# Patient Record
Sex: Female | Born: 1980 | Hispanic: No | Marital: Married | Smoking: Never smoker
Health system: Southern US, Community
[De-identification: ages and names within clinical notes are randomized; demographics above are authoritative.]

## PROBLEM LIST (undated history)

## (undated) DIAGNOSIS — F329 Major depressive disorder, single episode, unspecified: Secondary | ICD-10-CM

## (undated) DIAGNOSIS — F32A Depression, unspecified: Secondary | ICD-10-CM

## (undated) DIAGNOSIS — T7840XA Allergy, unspecified, initial encounter: Secondary | ICD-10-CM

## (undated) DIAGNOSIS — L309 Dermatitis, unspecified: Secondary | ICD-10-CM

## (undated) DIAGNOSIS — F419 Anxiety disorder, unspecified: Secondary | ICD-10-CM

## (undated) DIAGNOSIS — E079 Disorder of thyroid, unspecified: Secondary | ICD-10-CM

## (undated) DIAGNOSIS — J45909 Unspecified asthma, uncomplicated: Secondary | ICD-10-CM

## (undated) HISTORY — DX: Disorder of thyroid, unspecified: E07.9

## (undated) HISTORY — DX: Major depressive disorder, single episode, unspecified: F32.9

## (undated) HISTORY — DX: Anxiety disorder, unspecified: F41.9

## (undated) HISTORY — DX: Depression, unspecified: F32.A

## (undated) HISTORY — DX: Allergy, unspecified, initial encounter: T78.40XA

## (undated) HISTORY — DX: Unspecified asthma, uncomplicated: J45.909

## (undated) HISTORY — DX: Dermatitis, unspecified: L30.9

---

## 2010-06-24 DIAGNOSIS — J309 Allergic rhinitis, unspecified: Secondary | ICD-10-CM | POA: Insufficient documentation

## 2010-06-24 DIAGNOSIS — J329 Chronic sinusitis, unspecified: Secondary | ICD-10-CM | POA: Insufficient documentation

## 2010-06-24 DIAGNOSIS — J45909 Unspecified asthma, uncomplicated: Secondary | ICD-10-CM | POA: Insufficient documentation

## 2010-06-24 DIAGNOSIS — E669 Obesity, unspecified: Secondary | ICD-10-CM | POA: Insufficient documentation

## 2010-06-24 DIAGNOSIS — K219 Gastro-esophageal reflux disease without esophagitis: Secondary | ICD-10-CM | POA: Insufficient documentation

## 2010-09-10 DIAGNOSIS — K589 Irritable bowel syndrome without diarrhea: Secondary | ICD-10-CM | POA: Insufficient documentation

## 2010-09-10 DIAGNOSIS — G4733 Obstructive sleep apnea (adult) (pediatric): Secondary | ICD-10-CM | POA: Insufficient documentation

## 2010-09-10 DIAGNOSIS — E8881 Metabolic syndrome: Secondary | ICD-10-CM | POA: Insufficient documentation

## 2010-10-02 DIAGNOSIS — R5383 Other fatigue: Secondary | ICD-10-CM | POA: Insufficient documentation

## 2011-05-14 DIAGNOSIS — G4489 Other headache syndrome: Secondary | ICD-10-CM | POA: Insufficient documentation

## 2012-12-14 DIAGNOSIS — E559 Vitamin D deficiency, unspecified: Secondary | ICD-10-CM | POA: Insufficient documentation

## 2012-12-29 DIAGNOSIS — E042 Nontoxic multinodular goiter: Secondary | ICD-10-CM | POA: Insufficient documentation

## 2014-09-04 ENCOUNTER — Ambulatory Visit (INDEPENDENT_AMBULATORY_CARE_PROVIDER_SITE_OTHER): Payer: 59 | Admitting: Physician Assistant

## 2014-09-04 VITALS — BP 120/80 | HR 74 | Temp 98.2°F | Resp 16 | Ht 64.0 in | Wt 218.0 lb

## 2014-09-04 DIAGNOSIS — N943 Premenstrual tension syndrome: Secondary | ICD-10-CM

## 2014-09-04 DIAGNOSIS — F3281 Premenstrual dysphoric disorder: Secondary | ICD-10-CM | POA: Insufficient documentation

## 2014-09-04 MED ORDER — CITALOPRAM HYDROBROMIDE 10 MG PO TABS: 10.0000 mg | ORAL_TABLET | Freq: Every day | ORAL | Status: DC

## 2014-09-04 MED ORDER — IBUPROFEN 800 MG PO TABS: ORAL_TABLET | ORAL | Status: DC

## 2014-09-04 MED ORDER — CLONAZEPAM 0.5 MG PO TABS: 0.2500 mg | ORAL_TABLET | Freq: Two times a day (BID) | ORAL | Status: DC | PRN

## 2014-09-04 MED ORDER — CLONAZEPAM 0.5 MG PO TABS: 0.2500 mg | ORAL_TABLET | Freq: Two times a day (BID) | ORAL | Status: AC | PRN

## 2014-09-04 NOTE — Progress Notes (Signed)
09/04/2014 at 8:51 PM  [name] / DOB: 1980-06-28 / MRN: [id number]  The patient  does not have a problem list on file.  SUBJECTIVE  Chief complaint: Other   Ms. Gassen is a well appearing 34 y.o. female complaining of mood swings and agitation that starts roughly one week before her period. She notably "snap and screams" at her husband during these periods and would like to consider medicine to control these symptoms. She has tried ibuprofen 400 qd for her symptoms which did not help.  She has also taken OCPs in the past and reports that she bleed constantly while taking them.  She has a history of anxiety and depression and is not taking an SSRI currently. She takes 40 mg of omeprazole daily for GERD.  She denies depression, anhedonia, poor sleep, changes in concentration and appetite, psychomotor slowing, guilt and hopelessness, and SI/HI. She does report some marital stress at this time, but does not want to go into details.  She and her husband are seeing a marriage Veterinary surgeon.     She  has a past medical history of Allergy; Anxiety; Asthma; Depression; and Thyroid disease.    Medications reviewed and updated by myself where necessary, and exist elsewhere in the encounter.   Ms. Remmert is allergic to fish allergy; peanuts; amoxicillin; molds & smuts; percocet; and prednisone. She  reports that she has never smoked. She does not have any smokeless tobacco history on file. She  has no sexual activity history on file. The patient  has no past surgical history on file.  Her family history includes Cancer in her maternal grandfather, maternal grandmother, paternal grandfather, and paternal grandmother; Hypertension in her maternal grandfather, maternal grandmother, paternal grandfather, and paternal grandmother; Thyroid disease in her mother.  Review of Systems  Constitutional: Negative for fever.  Eyes: Negative.   Respiratory: Negative for cough.   Cardiovascular: Negative  for chest pain.  Gastrointestinal: Negative.   Genitourinary: Negative.   Musculoskeletal: Negative.   Skin: Negative for itching and rash.  Neurological: Negative.  Negative for headaches.  Endo/Heme/Allergies: Negative.   Psychiatric/Behavioral: Negative.     OBJECTIVE  Her  height is  (1.626 m) and weight is 218 lb (98.884 kg). Her oral temperature is 98.2 F (36.8 C). Her blood pressure is 120/80 and her pulse is 74. Her respiration is 16 and oxygen saturation is 100%.  The patient's body mass index is 37.4 kg/(m^2).  Physical Exam  Constitutional: She is oriented to person, place, and time. She appears well-developed and well-nourished. No distress.  Neck: Normal range of motion.  Cardiovascular: Normal rate, regular rhythm, normal heart sounds and intact distal pulses.  Exam reveals no gallop and no friction rub.   No murmur heard. Respiratory: Effort normal and breath sounds normal.  GI: Soft. Bowel sounds are normal.  Musculoskeletal: Normal range of motion. She exhibits no edema or tenderness.  Neurological: She is alert and oriented to person, place, and time. She has normal reflexes. No cranial nerve deficit. Coordination normal.  Skin: Skin is warm and dry. No rash noted. She is not diaphoretic. No erythema. No pallor.   Mental Status Exam:  Appearance: Approprate to the situation. Seen crying at times. Eye Contact::  Good Psychomotor:  Normal Speech:  Clear and Coherent and Normal Rate Volume:  Normal Mood:  "Even and happy" Affect:  Non-Congruent and anxious and sad Thought Process:  Intact Orientation:  Full (Time, Place, and Person) Thought Content:  Negative Suicidal  Thoughts:  No Homicidal Thoughts:  No Judgement:  Fair Insight:  Lacking   No results found for this or any previous visit (from the past 24 hour(s)).  ASSESSMENT & PLAN  Britta MccreedyBarbara was seen today for other.  Diagnoses and all orders for this visit:  Premenstrual dysphoric disorder:  Patient with several hall marks of this condition.  Will treat with SSRI therapy for mood.  Decreased dose of Celexa chosen given that patient is taking 40 mg of omeprazole daily, which will likely yield a dose of citalopram equivalent to 20 mg daily.  Other options discussed regarding birth control or implantable device that would cause her to no longer have periods, but patient had a poor response to this in the past. Will provide a short, one time course of Klonopin prn for bridge therapy.  She is to take 800 mg ibuprofen prn for somatic symptoms.  Orders: -     citalopram (CELEXA) 10 MG tablet; Take 1 tablet (10 mg total) by mouth daily. Take 1/2 tab for the first week only. -     clonazePAM (KLONOPIN) 0.5 MG tablet; Take 0.5 tablets (0.25 mg total) by mouth 2 (two) times daily as needed for anxiety. -     ibuprofen (ADVIL,MOTRIN) 800 MG tablet; Take 1 tab every eight hours for pain   The patient was advised to call or come back to clinic if she does not see an improvement in symptoms, or worsens with the above plan.   Deliah BostonMichael Buren Havey, MHS, PA-C [facility] 09/04/2014 8:51 PM

## 2014-12-05 ENCOUNTER — Other Ambulatory Visit: Payer: Self-pay | Admitting: Family Medicine

## 2016-02-05 ENCOUNTER — Other Ambulatory Visit (HOSPITAL_COMMUNITY): Payer: Self-pay | Admitting: Obstetrics

## 2016-02-05 DIAGNOSIS — Z3149 Encounter for other procreative investigation and testing: Secondary | ICD-10-CM

## 2016-02-09 ENCOUNTER — Ambulatory Visit (HOSPITAL_COMMUNITY)
Admission: RE | Admit: 2016-02-09 | Discharge: 2016-02-09 | Disposition: A | Discharge status: Home / Self Care | Payer: 59 | Source: Ambulatory Visit | Attending: Obstetrics | Admitting: Obstetrics

## 2016-02-09 ENCOUNTER — Encounter (INDEPENDENT_AMBULATORY_CARE_PROVIDER_SITE_OTHER): Payer: Self-pay

## 2016-02-09 DIAGNOSIS — Z3149 Encounter for other procreative investigation and testing: Secondary | ICD-10-CM

## 2016-02-09 DIAGNOSIS — N979 Female infertility, unspecified: Secondary | ICD-10-CM | POA: Diagnosis present

## 2016-02-09 MED ORDER — IOPAMIDOL (ISOVUE-300) INJECTION 61%
30.0000 mL | Freq: Once | INTRAVENOUS | Status: AC | PRN
  Administered 2016-02-09: 5 mL

## 2016-07-27 ENCOUNTER — Encounter: Payer: Self-pay | Admitting: Dietician

## 2016-07-27 ENCOUNTER — Encounter: Payer: 59 | Attending: Internal Medicine | Admitting: Dietician

## 2016-07-27 DIAGNOSIS — Z713 Dietary counseling and surveillance: Secondary | ICD-10-CM | POA: Diagnosis present

## 2016-07-27 DIAGNOSIS — E669 Obesity, unspecified: Secondary | ICD-10-CM | POA: Diagnosis not present

## 2016-07-27 DIAGNOSIS — IMO0001 Reserved for inherently not codable concepts without codable children: Secondary | ICD-10-CM

## 2016-07-27 NOTE — Patient Instructions (Addendum)
Continue to be active most days of the week. Resume your vitamin D  Consider meal planning. Avoid skipping meals.  A small meal is fine. A small snack is fine if you are hungry. Increase your vegetable intake particularly the non starchy vegetables. (VARIETY)  Choose whole grains.   2 or less servings of dairy per day. Aerobic and resistance training Consider Omega 3 (1500mg -4 grams daily) Consider Inositol (1.2-1.4 g/day)

## 2016-07-27 NOTE — Progress Notes (Signed)
  Medical Nutrition Therapy:  Appt start time: 1410 end time:  1510.   Assessment:  Primary concerns today: Patient is here today alone.  She would like to lose weight.  Hx includes:  IBS, many allergies, infertility, GERD, PCOS, hypothyroidism, plantar fascitis, and vitamin D deficiency.  Weight hx: 242 lbs today= highest weight 218 lbs 14 months ago prior to starting medications for infertility. 149 lbs 6 years ago= lowest weight (working out a lot)   Patient lives with her husband and mother.  Patient does the shopping and cooking.  Patient works for [employer] from home.  She went to school for nutrition and fitness and is currently going back to culinary school.  Preferred Learning Style:   No preference indicated   Learning Readiness:   Ready  MEDICATIONS: see list   DIETARY INTAKE: 1-2 meals 2-3 snacks "grazes" throughout morning and afternoon while she is working [employer]ed foods include pork, dark soda (makes IBS worse), apples, almonds, and shellfish (anyfalaxis),  "I do not like to eat if I am not hungry.", cooks without oil  24-hr recall:  Starts work at 6 am B ( AM): skips "never a breakfast person" Snk (9:30 AM): instant oatmeal (1 pack) L (1 PM): skips more than half the time - Malaw[place]urkey and provolone sandwich on [employer] with ranch, OJ Snk ( PM): homemade oatmeal cookies, ritz crackers, grapes, or chocolate D (6:30 PM): baked fish or chicken, rice, broccoli OR hamburger and fries Snk ( PM): none Beverages: low acid OJ, occasional sprite, water  Usual physical activity: walk daily about 2 miles with dogs, squats and weight lifting 2 times per week.  Estimated energy needs: 1400 calories 158 g carbohydrates 105 g protein 39 g fat  Progress Towards Goal(s):  In progress.   Nutritional Diagnosis:  NB-1.1 Food and nutrition-related knowledge deficit As related to weight loss.  As evidenced by patient report.    Intervention:  Nutrition counseling/education  related to weight loss.  Discussed importance of a regular meal schedule, avoid skipping meals, taking the Vitamin D, and continuing to stay active.  Patient stated that she felt that she needed to meal plan again.  Also discussed guidelines for PCOS.  Continue to be active most days of the week. Resume your vitamin D  Consider meal planning. Avoid skipping meals.  A small meal is fine. A small snack is fine if you are hungry. Increase your vegetable intake particularly the non starchy vegetables. (VARIETY)  Choose whole grains.   2 or less servings of dairy per day. Aerobic and resistance training Consider Omega 3 (1500mg -4 grams daily) Consider Inositol (1.2-1.4 g/day)  Teaching Method Utilized:  Visual Auditory Hands on  Handouts given during visit include:  Meal plan card  Snack list  Probiotic list  Barriers to learning/adherence to lifestyle change: none  Demonstrated degree of understanding via:  Teach Back   Monitoring/Evaluation:  Dietary intake, exercise, and body weight prn.

## 2016-08-22 ENCOUNTER — Emergency Department (HOSPITAL_COMMUNITY): Payer: 59

## 2016-08-22 ENCOUNTER — Emergency Department (HOSPITAL_COMMUNITY)
Admission: EM | Admit: 2016-08-22 | Discharge: 2016-08-22 | Disposition: A | Discharge status: Home / Self Care | Payer: 59 | Attending: Emergency Medicine | Admitting: Emergency Medicine

## 2016-08-22 ENCOUNTER — Encounter (HOSPITAL_COMMUNITY): Payer: Self-pay

## 2016-08-22 DIAGNOSIS — Z91010 Allergy to peanuts: Secondary | ICD-10-CM | POA: Insufficient documentation

## 2016-08-22 DIAGNOSIS — J45909 Unspecified asthma, uncomplicated: Secondary | ICD-10-CM | POA: Diagnosis not present

## 2016-08-22 DIAGNOSIS — J111 Influenza due to unidentified influenza virus with other respiratory manifestations: Secondary | ICD-10-CM | POA: Diagnosis not present

## 2016-08-22 DIAGNOSIS — Z79899 Other long term (current) drug therapy: Secondary | ICD-10-CM | POA: Diagnosis not present

## 2016-08-22 DIAGNOSIS — R69 Illness, unspecified: Secondary | ICD-10-CM

## 2016-08-22 DIAGNOSIS — R05 Cough: Secondary | ICD-10-CM | POA: Diagnosis present

## 2016-08-22 MED ORDER — ACETAMINOPHEN 325 MG PO TABS
ORAL_TABLET | ORAL | Status: AC
  Filled 2016-08-22: qty 2

## 2016-08-22 MED ORDER — ACETAMINOPHEN 325 MG PO TABS
650.0000 mg | ORAL_TABLET | Freq: Once | ORAL | Status: AC
  Administered 2016-08-22: 650 mg via ORAL

## 2016-08-22 NOTE — ED Provider Notes (Signed)
[facility]-EMERGENCY DEPT Provider Note   CSN: [id number] Arrival date & time: 08/22/16  1228   By signing my name below, I, [name], attest that this documentation has been prepared under the direction and in the presence of Jerelyn Scott, MD. Electronically Signed: [name], ED Scribe. 08/22/16. 2:45 PM.  History   Chief Complaint Chief Complaint  Patient presents with  . Cough    HPI Faith Moody is a 36 y.o. female with a PMHx of asthma, who presents to the Emergency Department complaining of productive cough x blood-tinged sputum onset 1 week worsening today. Pt reports associated CP x 4 days, generalized body aches, hoarse voice, and fever. Pt has tried Rx zithromax, albuterol inhaler BID, and tessalon perles, with mild relief of her symptoms. She notes that she was evaluated by her PCP 4 days ago and prescribed zithromax and given a steroid injection. Pt reports that she has sick contacts of her significant other who was diagnosed with pneumonia. She states that she has had to have several breathing treatments and she denies admission to the hospital for her asthma related issues. She denies any other symptoms.     The history is provided by the patient. No language interpreter was used.  Cough  This is a new problem. The current episode started more than 1 week ago. The problem occurs constantly. The problem has not changed since onset.The cough is productive of blood-tinged sputum. The maximum temperature recorded prior to her arrival was 103 to 104 F. The fever has been present for less than 1 day. Treatments tried: albuterol inhaler, zithromax, and tessalon perles. The treatment provided no relief. Her past medical history is significant for asthma.    Past Medical History:  Diagnosis Date  . Allergy   . Anxiety   . Asthma   . Depression   . Thyroid disease     Patient Active Problem List   Diagnosis Date Noted  . Premenstrual dysphoric disorder 09/04/2014     History reviewed. No pertinent surgical history.  OB History    No data available       Home Medications    Prior to Admission medications   Medication Sig Start Date End Date Taking? Authorizing Provider  albuterol (PROVENTIL, VENTOLIN) (5 MG/ML) 0.5% NEBU Take by nebulization continuous.    Historical Provider, MD  budesonide-formoterol (SYMBICORT) 160-4.5 MCG/ACT inhaler Inhale 2 puffs into the lungs 2 (two) times daily.    Historical Provider, MD  citalopram (CELEXA) 10 MG tablet TAKE 1/2 TABLET BY MOUTH FOR 1 WEEK THEN TAKE 1 TABLET BY MOUTH DAILY. 12/05/14   Ofilia Neas, PA-C  clonazePAM (KLONOPIN) 0.5 MG tablet Take 0.5 tablets (0.25 mg total) by mouth 2 (two) times daily as needed for anxiety. 09/04/14   Ofilia Neas, PA-C  ibuprofen (ADVIL,MOTRIN) 800 MG tablet Take 1 tab every eight hours for pain 09/04/14   Ofilia Neas, PA-C  loratadine (CLARITIN) 10 MG tablet Take 10 mg by mouth daily.    Historical Provider, MD  metFORMIN (GLUCOPHAGE) 1000 MG tablet Take 1,000 mg by mouth 2 (two) times daily with a meal.    Historical Provider, MD  mometasone (NASONEX) 50 MCG/ACT nasal spray Place 2 sprays into the nose daily.    Historical Provider, MD  omeprazole (PRILOSEC) 40 MG capsule Take 40 mg by mouth daily.    Historical Provider, MD  ranitidine (ZANTAC) 150 MG capsule Take 150 mg by mouth 2 (two) times daily.    Historical  Provider, MD    Family History Family History  Problem Relation Age of Onset  . Thyroid disease Mother   . Cancer Maternal Grandmother   . Hypertension Maternal Grandmother   . Hypertension Maternal Grandfather   . Cancer Maternal Grandfather   . Hypertension Paternal Grandmother   . Cancer Paternal Grandmother   . Hypertension Paternal Grandfather   . Cancer Paternal Grandfather     Social History Social History  Substance Use Topics  . Smoking status: Never Smoker  . Smokeless tobacco: Never Used  . Alcohol use Not on file      Allergies   Apple; Fish allergy; Peanuts [peanut oil]; Amoxicillin; Molds & smuts; Percocet [oxycodone-acetaminophen]; and Prednisone   Review of Systems Review of Systems  Respiratory: Positive for cough.   All other systems reviewed and are negative.    Physical Exam Updated Vital Signs BP 110/78 (BP Location: Right Arm)   Pulse 86   Temp 99.5 F (37.5 C) (Oral)   Resp 18   LMP 07/26/2016   SpO2 100%   Physical Exam  Constitutional: She is oriented to person, place, and time. She appears well-developed and well-nourished. No distress.  HENT:  Head: Normocephalic and atraumatic.  Mouth/Throat: Uvula is midline, oropharynx is clear and moist and mucous membranes are normal.  Eyes: EOM are normal.  Neck: Neck supple.  Cardiovascular: Normal rate, regular rhythm and normal heart sounds.  Exam reveals no gallop and no friction rub.   No murmur heard. Pulmonary/Chest: Effort normal. No respiratory distress. She has decreased breath sounds. She has no wheezes. She has no rales.  Diminished breath sounds bilaterally. Nl respiratory effort.  Abdominal: She exhibits no distension.  Musculoskeletal: Normal range of motion.  Neurological: She is alert and oriented to person, place, and time.  Skin: Skin is warm and dry.  Psychiatric: She has a normal mood and affect. Her behavior is normal.  Nursing note and vitals reviewed.    ED Treatments / Results  DIAGNOSTIC STUDIES: Oxygen Saturation is 99% on RA, nl by my interpretation.    COORDINATION OF CARE: 2:41 PM Discussed treatment plan with pt at bedside which includes CXR and pt agreed to plan.   Radiology Dg Chest 2 View  Result Date: 08/22/2016 CLINICAL DATA:  Cough. EXAM: CHEST  2 VIEW COMPARISON:  None. FINDINGS: The heart size and mediastinal contours are within normal limits. Both lungs are clear. The visualized skeletal structures are unremarkable. IMPRESSION: No active cardiopulmonary disease. Electronically  Signed   By: Gerome Sam III M.D   On: 08/22/2016 14:24    Procedures Procedures (including critical care time)  Medications Ordered in ED Medications  acetaminophen (TYLENOL) 325 MG tablet (not administered)  acetaminophen (TYLENOL) tablet 650 mg (650 mg Oral Given 08/22/16 1252)     Initial Impression / Assessment and Plan / ED Course  I have reviewed the triage vital signs and the nursing notes.  Pertinent labs & imaging results that were available during my care of the patient were reviewed by me and considered in my medical decision making (see chart for details).     Pt presenting with c/o fever, cough, congestion. Coughed up a small amount of blood this morning.  Pt has hx of asthma- saw her doctor yesterday and had steroid injection- she states she cannot take prednisone- she also was started on zpack- no pneumonia on CXR- vitals are improved after tylenol.  supect influenza like illness.  Pt to continue albuterol - increase to  every 4 hours.  Discharged with strict return precautions.  Pt agreeable with plan.  Final Clinical Impressions(s) / ED Diagnoses   Final diagnoses:  Influenza-like illness    New Prescriptions Discharge Medication List as of 08/22/2016  3:51 PM     I personally performed the services described in this documentation, which was scribed in my presence. The recorded information has been reviewed and is accurate.      Jerelyn Scott, MD 08/22/16 [phone]

## 2016-08-22 NOTE — Discharge Instructions (Signed)
Return to the ED with any concerns including difficulty breathing despite using albuterol every 4 hours, not drinking fluids, decreased urine output, vomiting and not able to keep down liquids or medications, decreased level of alertness/lethargy, or any other alarming symptoms °

## 2016-08-22 NOTE — ED Triage Notes (Addendum)
Patient complains of ongoing cough for 1 week and currently taking z-pack. States that she has generalized body aches for same. Now has some blood tinged sputum with the cough. Doesn't appear ill, NAD. Mask provided at triage

## 2016-08-24 ENCOUNTER — Encounter (HOSPITAL_COMMUNITY): Payer: Self-pay

## 2016-08-24 ENCOUNTER — Emergency Department (HOSPITAL_COMMUNITY)
Admission: EM | Admit: 2016-08-24 | Discharge: 2016-08-24 | Disposition: A | Discharge status: Home / Self Care | Payer: 59 | Attending: Emergency Medicine | Admitting: Emergency Medicine

## 2016-08-24 DIAGNOSIS — E876 Hypokalemia: Secondary | ICD-10-CM

## 2016-08-24 DIAGNOSIS — Z7984 Long term (current) use of oral hypoglycemic drugs: Secondary | ICD-10-CM | POA: Insufficient documentation

## 2016-08-24 DIAGNOSIS — Z91010 Allergy to peanuts: Secondary | ICD-10-CM | POA: Diagnosis not present

## 2016-08-24 DIAGNOSIS — J45909 Unspecified asthma, uncomplicated: Secondary | ICD-10-CM | POA: Insufficient documentation

## 2016-08-24 DIAGNOSIS — R202 Paresthesia of skin: Secondary | ICD-10-CM | POA: Insufficient documentation

## 2016-08-24 LAB — I-STAT CHEM 8, ED
BUN: 12 mg/dL (ref 6–20)
Calcium, Ion: 1.11 mmol/L — ABNORMAL LOW (ref 1.15–1.40)
Chloride: 105 mmol/L (ref 101–111)
Creatinine, Ser: 0.9 mg/dL (ref 0.44–1.00)
Glucose, Bld: 106 mg/dL — ABNORMAL HIGH (ref 65–99)
HCT: 40 % (ref 36.0–46.0)
Hemoglobin: 13.6 g/dL (ref 12.0–15.0)
Potassium: 3.1 mmol/L — ABNORMAL LOW (ref 3.5–5.1)
Sodium: 139 mmol/L (ref 135–145)
TCO2: 23 mmol/L (ref 0–100)

## 2016-08-24 NOTE — Discharge Instructions (Signed)
As needed albuterol use recommended. If respiratory symptoms develop, she is to schedule the albuterol as previously recommended. If facial paresthesia does not resolve, close PCP follow up in 3-5 days for electrolyte recheck recommended.

## 2016-08-24 NOTE — ED Provider Notes (Addendum)
[facility]-EMERGENCY DEPT Provider Note   CSN: [id number] Arrival date & time: 08/24/16  1134   By signing my name below, I, Avnee Patel, attest that this documentation has been prepared under the direction and in the presence of Nira Conn, MD  Electronically Signed: Clovis Pu, ED Scribe. 08/24/16. 12:53 PM.   History   Chief Complaint Chief Complaint  Patient presents with  . facial/bilateral arm tingling    HPI Comments:  Faith Moody is a 36 y.o. female, with a PMHx of asthma and anxiety, who presents to the Emergency Department complaining of acute onset, gradually worsening tingling to her face beginning at 4 AM today. She notes the tingling radiated to her bilateral upper extremities around 10 AM when she was having a panic attack and is now intermittent to this area. Pt also reports the sensation of lip swelling and states she has the sensation that a pill is stuck in her throat. She notes she has continued her z-pak and states she has been using tessalon and her albuterol inhaler frequently, 4 times a day, for 2 days. She last used her albuterol inhaler about 6 hours ago. No alleviating or aggravating factors noted. Pt denies a change in medication dose, alcohol use and recreational drug use. Pt also denies weakness, swelling, fevers, rash, difficulty breathing or any other associated symptoms. No other complaints noted at this time.   The history is provided by the patient. No language interpreter was used.    Past Medical History:  Diagnosis Date  . Allergy   . Anxiety   . Asthma   . Depression   . Thyroid disease     Patient Active Problem List   Diagnosis Date Noted  . Premenstrual dysphoric disorder 09/04/2014    History reviewed. No pertinent surgical history.  OB History    No data available       Home Medications    Prior to Admission medications   Medication Sig Start Date End Date Taking? Authorizing Provider  albuterol (PROVENTIL,  VENTOLIN) (5 MG/ML) 0.5% NEBU Take by nebulization continuous.    Historical Provider, MD  budesonide-formoterol (SYMBICORT) 160-4.5 MCG/ACT inhaler Inhale 2 puffs into the lungs 2 (two) times daily.    Historical Provider, MD  citalopram (CELEXA) 10 MG tablet TAKE 1/2 TABLET BY MOUTH FOR 1 WEEK THEN TAKE 1 TABLET BY MOUTH DAILY. 12/05/14   Ofilia Neas, PA-C  clonazePAM (KLONOPIN) 0.5 MG tablet Take 0.5 tablets (0.25 mg total) by mouth 2 (two) times daily as needed for anxiety. 09/04/14   Ofilia Neas, PA-C  ibuprofen (ADVIL,MOTRIN) 800 MG tablet Take 1 tab every eight hours for pain 09/04/14   Ofilia Neas, PA-C  loratadine (CLARITIN) 10 MG tablet Take 10 mg by mouth daily.    Historical Provider, MD  metFORMIN (GLUCOPHAGE) 1000 MG tablet Take 1,000 mg by mouth 2 (two) times daily with a meal.    Historical Provider, MD  mometasone (NASONEX) 50 MCG/ACT nasal spray Place 2 sprays into the nose daily.    Historical Provider, MD  omeprazole (PRILOSEC) 40 MG capsule Take 40 mg by mouth daily.    Historical Provider, MD  ranitidine (ZANTAC) 150 MG capsule Take 150 mg by mouth 2 (two) times daily.    Historical Provider, MD    Family History Family History  Problem Relation Age of Onset  . Thyroid disease Mother   . Cancer Maternal Grandmother   . Hypertension Maternal Grandmother   . Hypertension Maternal Grandfather   .  Cancer Maternal Grandfather   . Hypertension Paternal Grandmother   . Cancer Paternal Grandmother   . Hypertension Paternal Grandfather   . Cancer Paternal Grandfather     Social History Social History  Substance Use Topics  . Smoking status: Never Smoker  . Smokeless tobacco: Never Used  . Alcohol use Not on file     Allergies   Apple; Fish allergy; Peanuts [peanut oil]; Amoxicillin; Molds & smuts; Percocet [oxycodone-acetaminophen]; and Prednisone   Review of Systems  Review of Systems All other systems reviewed and are negative for acute change except  as noted in the HPI.  Physical Exam Updated Vital Signs BP (!) 124/96   Pulse 91   Temp 99.6 F (37.6 C) (Oral)   Resp 18   LMP 07/26/2016   SpO2 100%   Physical Exam  Constitutional: She is oriented to person, place, and time. She appears well-developed and well-nourished. No distress.  HENT:  Head: Normocephalic and atraumatic.  Right Ear: Tympanic membrane normal. No middle ear effusion.  Left Ear: Tympanic membrane normal.  No middle ear effusion.  Nose: Nose normal.  Mouth/Throat: Uvula is midline. No uvula swelling. No posterior oropharyngeal edema or posterior oropharyngeal erythema. No tonsillar exudate.  No facial swelling   Eyes: Conjunctivae and EOM are normal. Pupils are equal, round, and reactive to light. Right eye exhibits no discharge. Left eye exhibits no discharge. No scleral icterus.  Neck: Normal range of motion. Neck supple.  Cardiovascular: Normal rate and regular rhythm.  Exam reveals no gallop and no friction rub.   No murmur heard. Pulmonary/Chest: Effort normal and breath sounds normal. No stridor. No respiratory distress. She has no wheezes. She has no rales.  Abdominal: Soft. She exhibits no distension. There is no tenderness.  Musculoskeletal: She exhibits no edema or tenderness.  Negative Chvostek's sign.  Neurological: She is alert and oriented to person, place, and time.  Mental Status: Alert and oriented to person, place, and time. Attention and concentration normal. Speech clear. Recent memory is intact  Cranial Nerves  II Visual Fields: Intact to confrontation. Visual fields intact. III, IV, VI: Pupils equal and reactive to light and near. Full eye movement without nystagmus  V Facial Sensation: Normal. No weakness of masticatory muscles  VII: No facial weakness or asymmetry  VIII Auditory Acuity: Grossly normal  IX/X: The uvula is midline; the palate elevates symmetrically  XI: Normal sternocleidomastoid and trapezius strength  XII: The  tongue is midline. No atrophy or fasciculations.   Motor System: Muscle Strength: 5/5 and symmetric in the upper and lower extremities. No pronation or drift.  Muscle Tone: Tone and muscle bulk are normal in the upper and lower extremities.   Reflexes: DTRs: 2+ and symmetrical in all four extremities. Plantar responses are flexor bilaterally.  Coordination: Intact finger-to-nose. No tremor.  Sensation: Intact to light touch.  Gait: Routine gait normal    Skin: Skin is warm and dry. No rash noted. She is not diaphoretic. No erythema.  Psychiatric: She has a normal mood and affect.  Vitals reviewed.     ED Treatments / Results  DIAGNOSTIC STUDIES:  Oxygen Saturation is 100% on RA, normal by my interpretation.    COORDINATION OF CARE:  12:41 PM Discussed treatment plan with pt at bedside and pt agreed to plan.  Labs (all labs ordered are listed, but only abnormal results are displayed) Labs Reviewed  I-STAT CHEM 8, ED - Abnormal; Notable for the following:  Result Value   Potassium 3.1 (*)    Glucose, Bld 106 (*)    Calcium, Ion 1.11 (*)    All other components within normal limits    EKG  EKG Interpretation  Date/Time:  Tuesday August 24 2016 15:13:21 EDT Ventricular Rate:  75 PR Interval:  174 QRS Duration: 76 QT Interval:  378 QTC Calculation: 422 R Axis:   38 Text Interpretation:  Normal sinus rhythm Nonspecific T wave abnormality Abnormal ECG no U waves noted Confirmed by [facility] MD, Kalev Temme ([id number]) on 08/24/2016 3:28:16 PM       Radiology No results found.  Procedures Procedures (including critical care time)  Medications Ordered in ED Medications - No data to display   Initial Impression / Assessment and Plan / ED Course  I have reviewed the triage vital signs and the nursing notes.  Pertinent labs & imaging results that were available during my care of the patient were reviewed by me and considered in my medical decision making (see chart for  details).     nonfocal paresthesia; doubt CVA. No evidence suggestive of allergic reaction or angioedema. Given increased use of albuterol, will check electrolyte for K+ level.  Chem 8 with hypokalemia and mild hypocalcemia. Hypokalemia likely shift due to increase albuterol use.  PRN albuterol use recommended. If respiratory symptoms develop, she is to schedule the albuterol as previously recommended. If facial paresthesia does not resolve, close PCP follow for electrolyte recheck recommended.  The patient is safe for discharge with strict return precautions.   Final Clinical Impressions(s) / ED Diagnoses   Final diagnoses:  Hypokalemia  Paresthesia   Disposition: Discharge  Condition: Good  I have discussed the results, Dx and Tx plan with the patient who expressed understanding and agree(s) with the plan. Discharge instructions discussed at great length. The patient was given strict return precautions who verbalized understanding of the instructions. No further questions at time of discharge.    New Prescriptions   No medications on file    Follow Up: Georgann Housekeeper, MD [address] [place] [address] [phone]  Schedule an appointment as soon as possible for a visit  in 5-7 days, If symptoms do not improve or  worsen   I personally performed the services described in this documentation, which was scribed in my presence. The recorded information has been reviewed and is accurate.       Nira Conn, MD 08/24/16 [phone]

## 2016-08-24 NOTE — ED Notes (Signed)
Hooked patient up to the monitor

## 2016-08-24 NOTE — ED Triage Notes (Signed)
Patient seen on Sunday for fever and resp. Symptoms. Last night had tingling to face and bilateral hands and arms. Alert and oriented, NAD. No neuro deficits. Reports that she had panic attack prior to coming to ED

## 2016-08-24 NOTE — ED Notes (Signed)
Phlebotomy to redraw chem8

## 2016-08-24 NOTE — ED Notes (Signed)
-  patient is resting

## 2016-08-31 ENCOUNTER — Encounter: Payer: Self-pay | Admitting: Neurology

## 2016-10-06 ENCOUNTER — Ambulatory Visit (INDEPENDENT_AMBULATORY_CARE_PROVIDER_SITE_OTHER): Payer: 59 | Admitting: Neurology

## 2016-10-06 ENCOUNTER — Encounter: Payer: Self-pay | Admitting: Neurology

## 2016-10-06 VITALS — BP 110/78 | HR 72 | Ht 64.0 in | Wt 237.0 lb

## 2016-10-06 DIAGNOSIS — G43009 Migraine without aura, not intractable, without status migrainosus: Secondary | ICD-10-CM

## 2016-10-06 DIAGNOSIS — R200 Anesthesia of skin: Secondary | ICD-10-CM

## 2016-10-06 MED ORDER — TOPIRAMATE 25 MG PO TABS: 75.0000 mg | ORAL_TABLET | Freq: Every day | ORAL | 0 refills | Status: DC

## 2016-10-06 MED ORDER — SUMATRIPTAN SUCCINATE 100 MG PO TABS: ORAL_TABLET | ORAL | 2 refills | Status: DC

## 2016-10-06 NOTE — Progress Notes (Signed)
NEUROLOGY CONSULTATION NOTE  Vernella Niznik MRN: [id number] DOB: 02-01-1981  Referring provider: Dr. Donette Larry Primary care provider: Dr. Donette Larry  Reason for consult:  migraine  HISTORY OF PRESENT ILLNESS: Faith Moody is a 36 year old female with asthma, anxiety and type 2 diabetes who presents for headache.  History supplemented by PCP note  Onset:  36 years old Location:  Left parietal region Quality:  pounding Intensity:  8.5/10 Aura:  no Prodrome:  no Postdrome:  no Associated symptoms:  Nausea, photophobia, phonophobia, blurred vision, dizziness.  She does not endorse thunderclap headache or severe headache waking her from sleep. Duration:  4 hours to all day Frequency:  1 to 2 days per week Frequency of abortive medication: 1 to 2 days per week Triggers/exacerbating factors:  no Relieving factors:  no Activity:  aggravates  Past NSAIDS:  no Past analgesics:  Tylenol Past abortive triptans:  Maxalt (increased headache) Past muscle relaxants:  no Past anti-emetic:  no Past antihypertensive medications:  no Past antidepressant medications:  no Past anticonvulsant medications:  no Past vitamins/Herbal/Supplements:  no Past antihistamines/decongestants:  no Other past therapies:  no  Current NSAIDS:  ibuprofen 800mg  Current analgesics:  Excedrin Current triptans:  no Current anti-emetic:  no Current muscle relaxants:  no Current anti-anxiolytic:  Klonopin Current sleep aide:  no Current Antihypertensive medications:  no Current Antidepressant medications:  Celexa 5mg  Current Anticonvulsant medications:  topiramate 50mg  Current Vitamins/Herbal/Supplements:  no Current Antihistamines/Decongestants:  Claritin Other therapy:  no  Caffeine:  no Alcohol:  no Smoker:  no Diet:  hydrates Exercise:  yes Depression/anxiety:  no Sleep hygiene:  Has OSA and cannot tolerate CPAP.  However, she reports sleeping well Family history of headache:  Mom,  grandmother  08/24/16, she presented to the ED for paresthesias and numbness of her face, traveling down her neck and into shoulders.  She developed a panic attack. BMP:  Na 139, K 3.1, Cl 105, CO2 23, glucose 106, BUN 12 and Cr 0.90.  EKG showed NSR with nonspecific T wave abnormalities.  She was diagnosed with hypokalemia.  Her PCP gave her supplements.  Sometimes she still notes facial numbness.  PAST MEDICAL HISTORY: Past Medical History:  Diagnosis Date  . Allergy   . Anxiety   . Asthma   . Depression   . Thyroid disease     PAST SURGICAL HISTORY: No past surgical history on file.  MEDICATIONS: Current Outpatient Prescriptions on File Prior to Visit  Medication Sig Dispense Refill  . albuterol (PROVENTIL, VENTOLIN) (5 MG/ML) 0.5% NEBU Take by nebulization continuous.    . budesonide-formoterol (SYMBICORT) 160-4.5 MCG/ACT inhaler Inhale 2 puffs into the lungs 2 (two) times daily.    . citalopram (CELEXA) 10 MG tablet TAKE 1/2 TABLET BY MOUTH FOR 1 WEEK THEN TAKE 1 TABLET BY MOUTH DAILY. 30 tablet 2  . clonazePAM (KLONOPIN) 0.5 MG tablet Take 0.5 tablets (0.25 mg total) by mouth 2 (two) times daily as needed for anxiety. 20 tablet 0  . ibuprofen (ADVIL,MOTRIN) 800 MG tablet Take 1 tab every eight hours for pain 30 tablet 0  . loratadine (CLARITIN) 10 MG tablet Take 10 mg by mouth daily.    . metFORMIN (GLUCOPHAGE) 1000 MG tablet Take 1,000 mg by mouth 2 (two) times daily with a meal.    . mometasone (NASONEX) 50 MCG/ACT nasal spray Place 2 sprays into the nose daily.    Marland Kitchen omeprazole (PRILOSEC) 40 MG capsule Take 40 mg by mouth daily.    Marland Kitchen  ranitidine (ZANTAC) 150 MG capsule Take 150 mg by mouth 2 (two) times daily.     No current facility-administered medications on file prior to visit.     ALLERGIES: Allergies  Allergen Reactions  . Apple Anaphylaxis  . Fish Allergy Anaphylaxis    All seafood  . Peanuts [Peanut Oil] Anaphylaxis    Tree nuts, All Nuts  . Amoxicillin Hives    . Molds & Smuts Hives  . Percocet [Oxycodone-Acetaminophen] Nausea And Vomiting  . Prednisone Other (See Comments)    Altered mental status    FAMILY HISTORY: Family History  Problem Relation Age of Onset  . Thyroid disease Mother   . Cancer Maternal Grandmother   . Hypertension Maternal Grandmother   . Hypertension Maternal Grandfather   . Cancer Maternal Grandfather   . Hypertension Paternal Grandmother   . Cancer Paternal Grandmother   . Hypertension Paternal Grandfather   . Cancer Paternal Grandfather     SOCIAL HISTORY: Social History   Social History  . Marital status: Married    Spouse name: N/A  . Number of children: N/A  . Years of education: N/A   Occupational History  . Not on file.   Social History Main Topics  . Smoking status: Never Smoker  . Smokeless tobacco: Never Used  . Alcohol use Not on file  . Drug use: Unknown  . Sexual activity: Not on file   Other Topics Concern  . Not on file   Social History Narrative  . No narrative on file    REVIEW OF SYSTEMS: Constitutional: No fevers, chills, or sweats, no generalized fatigue, change in appetite Eyes: No visual changes, double vision, eye pain Ear, nose and throat: No hearing loss, ear pain, nasal congestion, sore throat Cardiovascular: No chest pain, palpitations Respiratory:  No shortness of breath at rest or with exertion, wheezes GastrointestinaI: No nausea, vomiting, diarrhea, abdominal pain, fecal incontinence Genitourinary:  No dysuria, urinary retention or frequency Musculoskeletal:  No neck pain, back pain Integumentary: No rash, pruritus, skin lesions Neurological: as above Psychiatric: No depression, insomnia, anxiety Endocrine: No palpitations, fatigue, diaphoresis, mood swings, change in appetite, change in weight, increased thirst Hematologic/Lymphatic:  No purpura, petechiae. Allergic/Immunologic: no itchy/runny eyes, nasal congestion, recent allergic reactions,  rashes  PHYSICAL EXAM: Vitals:   10/06/16 0836  Pulse: 72  BP     110/78 General: No acute distress.  Patient appears well-groomed.  Head:  Normocephalic/atraumatic Eyes:  fundi examined but not visualized Neck: supple, no paraspinal tenderness, full range of motion Back: No paraspinal tenderness Heart: regular rate and rhythm Lungs: Clear to auscultation bilaterally. Vascular: No carotid bruits. Neurological Exam: Mental status: alert and oriented to person, place, and time, recent and remote memory intact, fund of knowledge intact, attention and concentration intact, speech fluent and not dysarthric, language intact. Cranial nerves: CN I: not tested CN II: pupils equal, round and reactive to light, visual fields intact CN III, IV, VI:  full range of motion, no nystagmus, no ptosis CN V: facial sensation intact CN VII: upper and lower face symmetric CN VIII: hearing intact CN IX, X: gag intact, uvula midline CN XI: sternocleidomastoid and trapezius muscles intact CN XII: tongue midline Bulk & Tone: normal, no fasciculations. Motor:  5/5 throughout  Sensation: temperature and vibration sensation intact. Deep Tendon Reflexes:  2+ throughout, toes downgoing.  Finger to nose testing:  Without dysmetria.  Heel to shin:  Without dysmetria.   Gait:  Normal station and stride.  Able to turn  and tandem walk. Romberg negative.  IMPRESSION: Migraine without aura Morbid obesity Intermittent facial numbness may be due to topiramate.  PLAN: 1.  Increase topiramate to 75mg  at bedtime.  Call in 4 weeks with update and we can adjust dose if needed. 2.  Take sumatriptan 100mg  at earliest onset of headache.  May repeat dose once in 2 hours if needed.  Do not exceed two tablets in 24 hours. 3.  Stop other pain medication (ibuprofen, Excedrin).  Limit use of pain relievers to no more than 2 days out of the week.  These medications include acetaminophen, ibuprofen, triptans and narcotics.  This  will help reduce risk of rebound headaches. 4.  Be aware of common food triggers such as processed sweets, processed foods with nitrites (such as deli meat, hot dogs, sausages), foods with MSG, alcohol (such as wine), chocolate, certain cheeses, certain fruits (dried fruits, some citrus fruit), vinegar, diet soda. 4.  Avoid caffeine 5.  Routine exercise 6.  Proper sleep hygiene 7.  Stay adequately hydrated with water 8.  Keep a headache diary. 9.  Maintain proper stress management. 10.  Weight loss 11.  Follow up in 4 months.  Thank you for allowing me to take part in the care of this patient.  Shon Millet, DO  CC:  Georgann Housekeeper, MD

## 2016-10-06 NOTE — Patient Instructions (Signed)
Migraine Recommendations: 1.  Increase topiramate to 75mg  at bedtime.  Call in 4 weeks with update and we can adjust dose if needed. 2.  Take sumatriptan 100mg  at earliest onset of headache.  May repeat dose once in 2 hours if needed.  Do not exceed two tablets in 24 hours. 3.  Stop other pain medication (ibuprofen, Excedrin).  Limit use of pain relievers to no more than 2 days out of the week.  These medications include acetaminophen, ibuprofen, triptans and narcotics.  This will help reduce risk of rebound headaches. 4.  Be aware of common food triggers such as processed sweets, processed foods with nitrites (such as deli meat, hot dogs, sausages), foods with MSG, alcohol (such as wine), chocolate, certain cheeses, certain fruits (dried fruits, some citrus fruit), vinegar, diet soda. 4.  Avoid caffeine 5.  Routine exercise 6.  Proper sleep hygiene 7.  Stay adequately hydrated with water 8.  Keep a headache diary. 9.  Maintain proper stress management. 10.  Do not skip meals. 11.  Follow up in 4 months.

## 2016-11-08 ENCOUNTER — Telehealth: Payer: Self-pay | Admitting: Neurology

## 2016-11-08 NOTE — Telephone Encounter (Signed)
PT called for her 30 day Topamax update with Dr Everlena CooperJaffe, she is out and needs a refill

## 2016-11-09 MED ORDER — TOPIRAMATE 100 MG PO TABS: 100.0000 mg | ORAL_TABLET | Freq: Every day | ORAL | 3 refills | Status: DC

## 2016-11-09 NOTE — Telephone Encounter (Signed)
Increase topiramate to $Remo [id number]$100mgte.

## 2016-11-09 NOTE — Telephone Encounter (Signed)
Spoke with patient and she said the Topamax 75mg  has helped a little but she does think an increase would be helpful.  Please advise.

## 2016-11-09 NOTE — Telephone Encounter (Signed)
Spoke with patient and told her that per Dr. Everlena CooperJaffe I had sent in a rx for the increased amount of 100mg  at bedtime.  I told her to call back in 4 weeks with an update.  Patient agreed,

## 2016-12-24 ENCOUNTER — Telehealth: Payer: Self-pay | Admitting: Neurology

## 2016-12-24 NOTE — Telephone Encounter (Signed)
Patient called needing to let Dr. Everlena CooperJaffe know after 30 days how she has been doing on the Topiramate medication. She said she is doing good. She has enough for 2 more weeks. She will need it refilled then. Thanks

## 2016-12-24 NOTE — Telephone Encounter (Signed)
Spoke w/Pt. Advsd her to contact her pharmacy when she is ready for a refill on her topiramate 100mg 

## 2016-12-24 NOTE — Telephone Encounter (Signed)
100 MG  °

## 2017-02-09 ENCOUNTER — Ambulatory Visit (INDEPENDENT_AMBULATORY_CARE_PROVIDER_SITE_OTHER): Payer: 59 | Admitting: Neurology

## 2017-02-09 ENCOUNTER — Encounter: Payer: Self-pay | Admitting: Neurology

## 2017-02-09 VITALS — BP 98/68 | HR 72 | Ht 64.0 in | Wt 235.8 lb

## 2017-02-09 DIAGNOSIS — G43009 Migraine without aura, not intractable, without status migrainosus: Secondary | ICD-10-CM

## 2017-02-09 MED ORDER — SUMATRIPTAN SUCCINATE 100 MG PO TABS: ORAL_TABLET | ORAL | 2 refills | Status: DC

## 2017-02-09 MED ORDER — TOPIRAMATE 100 MG PO TABS: 100.0000 mg | ORAL_TABLET | Freq: Every day | ORAL | 2 refills | Status: DC

## 2017-02-09 NOTE — Progress Notes (Signed)
NEUROLOGY FOLLOW UP OFFICE NOTE  Kynlei Piontek [id number]  HISTORY OF PRESENT ILLNESS: Faith Moody is a 36 year old female with asthma, anxiety and type 2 diabetes who follows up for migraine.  UPDATE: Migraine frequency improved.  However, she never received the sumatriptan, so she hasn't been taking any abortive therapy.   Intensity:  8.5/10 Duration:  3 days Frequency:  Twice a month Frequency of abortive medication: none Current NSAIDS:  no Current analgesics:  no Current triptans:  no Current anti-emetic:  no Current muscle relaxants:  no Current anti-anxiolytic:  Klonopin Current sleep aide:  no Current Antihypertensive medications:  no Current Antidepressant medications:  Celexa  Current Anticonvulsant medications:  topiramate  Current Vitamins/Herbal/Supplements:  no Current Antihistamines/Decongestants:  Claritin Other therapy:  no   Caffeine:  no Alcohol:  no Smoker:  no Diet:  hydrates Exercise:  yes Depression/anxiety:  no Sleep hygiene:  Has OSA and cannot tolerate CPAP.  However, she reports sleeping well  HISTORY:    Onset:  36 years old Location:  Left parietal region Quality:  pounding Initial Intensity:  8.5/10 Aura:  no Prodrome:  no Postdrome:  no Associated symptoms:  Nausea, photophobia, phonophobia, blurred vision, dizziness.  She does not endorse thunderclap headache or severe headache waking her from sleep. Initial Duration:  4 hours to all day Initial Frequency:  1 to 2 days per week Initial Frequency of abortive medication: 1 to 2 days per week Triggers/exacerbating factors:  no Relieving factors:  no Activity:  aggravates   Past NSAIDS:  ibuprofen, Excedrin Past analgesics:  Tylenol, Excedrin Past abortive triptans:  Maxalt (increased headache) Past muscle relaxants:  no Past anti-emetic:  no Past antihypertensive medications:  no Past antidepressant medications:  no Past anticonvulsant medications:  no Past  vitamins/Herbal/Supplements:  no Past antihistamines/decongestants:  no Other past therapies:  no   Family history of headache:  Mom, grandmother   08/24/16, she presented to the ED for paresthesias and numbness of her face, traveling down her neck and into shoulders.  She developed a panic attack. BMP:  Na 139, K 3.1, Cl 105, CO2 23, glucose 106, BUN 12 and Cr 0.90.  EKG showed NSR with nonspecific T wave abnormalities.  She was diagnosed with hypokalemia.  Her PCP gave her supplements.  Sometimes she still notes facial numbness.  PAST MEDICAL HISTORY: Past Medical History:  Diagnosis Date  . Allergy   . Anxiety   . Asthma   . Depression   . Thyroid disease     MEDICATIONS: Current Outpatient Prescriptions on File Prior to Visit  Medication Sig Dispense Refill  . albuterol (PROVENTIL, VENTOLIN) (5 MG/ML) 0.5% NEBU Take by nebulization continuous.    . budesonide-formoterol (SYMBICORT) 160-4.5 MCG/ACT inhaler Inhale 2 puffs into the lungs 2 (two) times daily.    . citalopram (CELEXA) 10 MG tablet TAKE 1/2 TABLET BY MOUTH FOR 1 WEEK THEN TAKE 1 TABLET BY MOUTH DAILY. 30 tablet 2  . clonazePAM (KLONOPIN) 0.5 MG tablet Take 0.5 tablets (0.25 mg total) by mouth 2 (two) times daily as needed for anxiety. 20 tablet 0  . ibuprofen (ADVIL,MOTRIN) 800 MG tablet Take 1 tab every eight hours for pain (Patient not taking: Reported on 02/09/2017) 30 tablet 0  . loratadine (CLARITIN) 10 MG tablet Take 10 mg by mouth daily.    . metFORMIN (GLUCOPHAGE) 1000 MG tablet Take 1,000 mg by mouth 2 (two) times daily with a meal.    . mometasone (NASONEX) 50 MCG/ACT nasal  spray Place 2 sprays into the nose daily.    Marland Kitchen omeprazole (PRILOSEC) 40 MG capsule Take 40 mg by mouth daily.    . ranitidine (ZANTAC) 150 MG capsule Take 150 mg by mouth 2 (two) times daily.     No current facility-administered medications on file prior to visit.     ALLERGIES: Allergies  Allergen Reactions  . Apple Anaphylaxis  .  Fish Allergy Anaphylaxis    All seafood  . Peanuts [Peanut Oil] Anaphylaxis    Tree nuts, All Nuts  . Amoxicillin Hives  . Molds & Smuts Hives  . Percocet [Oxycodone-Acetaminophen] Nausea And Vomiting  . Prednisone Other (See Comments)    Altered mental status    FAMILY HISTORY: Family History  Problem Relation Age of Onset  . Thyroid disease Mother   . Cancer Maternal Grandmother   . Hypertension Maternal Grandmother   . Hypertension Maternal Grandfather   . Cancer Maternal Grandfather   . Hypertension Paternal Grandmother   . Cancer Paternal Grandmother   . Hypertension Paternal Grandfather   . Cancer Paternal Grandfather     SOCIAL HISTORY: Social History   Social History  . Marital status: Married    Spouse name: N/A  . Number of children: N/A  . Years of education: N/A   Occupational History  . Not on file.   Social History Main Topics  . Smoking status: Never Smoker  . Smokeless tobacco: Never Used  . Alcohol use Not on file  . Drug use: Unknown  . Sexual activity: Not on file   Other Topics Concern  . Not on file   Social History Narrative  . No narrative on file    REVIEW OF SYSTEMS: Constitutional: No fevers, chills, or sweats, no generalized fatigue, change in appetite Eyes: No visual changes, double vision, eye pain Ear, nose and throat: No hearing loss, ear pain, nasal congestion, sore throat Cardiovascular: No chest pain, palpitations Respiratory:  No shortness of breath at rest or with exertion, wheezes GastrointestinaI: No nausea, vomiting, diarrhea, abdominal pain, fecal incontinence Genitourinary:  No dysuria, urinary retention or frequency Musculoskeletal:  No neck pain, back pain Integumentary: No rash, pruritus, skin lesions Neurological: as above Psychiatric: No depression, insomnia, anxiety Endocrine: No palpitations, fatigue, diaphoresis, mood swings, change in appetite, change in weight, increased thirst Hematologic/Lymphatic:   No purpura, petechiae. Allergic/Immunologic: no itchy/runny eyes, nasal congestion, recent allergic reactions, rashes  PHYSICAL EXAM: Vitals:   02/09/17 1424  BP: 98/68  Pulse: 72  SpO2: 98%   General: No acute distress.  Patient appears well-groomed.  Morbidly obese body habitus. Head:  Normocephalic/atraumatic Eyes:  Fundi examined but not visualized Neck: supple, no paraspinal tenderness, full range of motion Heart:  Regular rate and rhythm Lungs:  Clear to auscultation bilaterally Back: No paraspinal tenderness Neurological Exam: alert and oriented to person, place, and time. Attention span and concentration intact, recent and remote memory intact, fund of knowledge intact.  Speech fluent and not dysarthric, language intact.  CN II-XII intact. Bulk and tone normal, muscle strength 5/5 throughout.  Sensation to light touch  intact.  Deep tendon reflexes 2+ throughout.  Finger to nose testing intact.  Gait normal  IMPRESSION: Migraine without aura  PLAN: 1.  Continue topiramate  at bedtime 2.  [facility] order to [facility] [address]) for sumatriptan  3.  Weight loss 4.  Follow up in 4 months.  Shon Millet, DO  CC:  Georgann Housekeeper, MD

## 2017-06-13 ENCOUNTER — Ambulatory Visit (INDEPENDENT_AMBULATORY_CARE_PROVIDER_SITE_OTHER): Payer: 59 | Admitting: Neurology

## 2017-06-13 ENCOUNTER — Encounter: Payer: Self-pay | Admitting: Neurology

## 2017-06-13 VITALS — BP 106/74 | HR 86 | Ht 64.0 in | Wt 229.8 lb

## 2017-06-13 DIAGNOSIS — G43009 Migraine without aura, not intractable, without status migrainosus: Secondary | ICD-10-CM | POA: Diagnosis not present

## 2017-06-13 NOTE — Progress Notes (Addendum)
NEUROLOGY FOLLOW UP OFFICE NOTE  Faith AllegraBarbara Proto [id number]  HISTORY OF PRESENT ILLNESS: Faith Moody is a 37 year old female with asthma, anxiety and type 2 diabetes who follows up for migraine.   UPDATE: She has had increase in headache over past 2 months due to stress, especially last 3 weeks since starting bupropion by her psychiatrist. Intensity:  severe Duration:  1 day Frequency:  Every other day Frequency of abortive medication: none Current NSAIDS:  no Current analgesics:  no Current triptans:  sumatriptan 100mg  Current anti-emetic:  no Current muscle relaxants:  no Current anti-anxiolytic:  Klonopin Current sleep aide:  no Current Antihypertensive medications:  no Current Antidepressant medications:  Celexa 5mg  Current Anticonvulsant medications:  topiramate 100mg  Current Vitamins/Herbal/Supplements:  no Current Antihistamines/Decongestants:  Claritin Other therapy:  no   Caffeine:  no Alcohol:  no Smoker:  no Diet:  hydrates Exercise:  yes Depression/anxiety:  Work-related stress.  However, she got another job and will be leaving current job in a couple of weeks. Sleep hygiene:  Has OSA and cannot tolerate CPAP.  However, she reports sleeping well   HISTORY:    Onset:  37 years old Location:  Left parietal region Quality:  pounding Initial Intensity:  8.5/10 Aura:  no Prodrome:  no Postdrome:  no Associated symptoms:  Nausea, photophobia, phonophobia, blurred vision, dizziness.  She does not endorse thunderclap headache or severe headache waking her from sleep. Initial Duration:  4 hours to all day Initial Frequency:  1 to 2 days per week Initial Frequency of abortive medication: 1 to 2 days per week Triggers/exacerbating factors:  no Relieving factors:  no Activity:  aggravates   Past NSAIDS:  ibuprofen, Excedrin Past analgesics:  Tylenol, Excedrin Past abortive triptans:  Maxalt (increased headache) Past muscle relaxants:  no Past  anti-emetic:  no Past antihypertensive medications:  no Past antidepressant medications:  no Past anticonvulsant medications:  no Past vitamins/Herbal/Supplements:  no Past antihistamines/decongestants:  no Other past therapies:  no   Family history of headache:  Mom, grandmother  PAST MEDICAL HISTORY: Past Medical History:  Diagnosis Date  . Allergy   . Anxiety   . Asthma   . Depression   . Thyroid disease     MEDICATIONS: Current Outpatient Medications on File Prior to Visit  Medication Sig Dispense Refill  . buPROPion (WELLBUTRIN XL) 150 MG 24 hr tablet Take 150 mg by mouth 2 (two) times daily.    Marland Kitchen. albuterol (PROVENTIL, VENTOLIN) (5 MG/ML) 0.5% NEBU Take by nebulization continuous.    . budesonide-formoterol (SYMBICORT) 160-4.5 MCG/ACT inhaler Inhale 2 puffs into the lungs 2 (two) times daily.    . citalopram (CELEXA) 10 MG tablet TAKE 1/2 TABLET BY MOUTH FOR 1 WEEK THEN TAKE 1 TABLET BY MOUTH DAILY. 30 tablet 2  . clonazePAM (KLONOPIN) 0.5 MG tablet Take 0.5 tablets (0.25 mg total) by mouth 2 (two) times daily as needed for anxiety. 20 tablet 0  . ibuprofen (ADVIL,MOTRIN) 800 MG tablet Take 1 tab every eight hours for pain (Patient not taking: Reported on 02/09/2017) 30 tablet 0  . loratadine (CLARITIN) 10 MG tablet Take 10 mg by mouth daily.    . metFORMIN (GLUCOPHAGE) 1000 MG tablet Take 1,000 mg by mouth 2 (two) times daily with a meal.    . mometasone (NASONEX) 50 MCG/ACT nasal spray Place 2 sprays into the nose daily.    Marland Kitchen. omeprazole (PRILOSEC) 40 MG capsule Take 40 mg by mouth daily.    .Marland Kitchen  ranitidine (ZANTAC) 150 MG capsule Take 150 mg by mouth 2 (two) times daily.    . SUMAtriptan (IMITREX) 100 MG tablet Take 1 tablet earliest onset of migraine.  May repeat once in 2 hours if headache persists or recurs. 10 tablet 2  . topiramate (TOPAMAX) 100 MG tablet Take 1 tablet (100 mg total) by mouth at bedtime. 90 tablet 2   No current facility-administered medications on file  prior to visit.     ALLERGIES: Allergies  Allergen Reactions  . Apple Anaphylaxis  . Fish Allergy Anaphylaxis    All seafood  . Peanuts [Peanut Oil] Anaphylaxis    Tree nuts, All Nuts  . Amoxicillin Hives  . Molds & Smuts Hives  . Percocet [Oxycodone-Acetaminophen] Nausea And Vomiting  . Prednisone Other (See Comments)    Altered mental status    FAMILY HISTORY: Family History  Problem Relation Age of Onset  . Thyroid disease Mother   . Cancer Maternal Grandmother   . Hypertension Maternal Grandmother   . Hypertension Maternal Grandfather   . Cancer Maternal Grandfather   . Hypertension Paternal Grandmother   . Cancer Paternal Grandmother   . Hypertension Paternal Grandfather   . Cancer Paternal Grandfather     SOCIAL HISTORY: Social History   Socioeconomic History  . Marital status: Married    Spouse name: Not on file  . Number of children: Not on file  . Years of education: Not on file  . Highest education level: Not on file  Social Needs  . Financial resource strain: Not on file  . Food insecurity - worry: Not on file  . Food insecurity - inability: Not on file  . Transportation needs - medical: Not on file  . Transportation needs - non-medical: Not on file  Occupational History  . Not on file  Tobacco Use  . Smoking status: Never Smoker  . Smokeless tobacco: Never Used  Substance and Sexual Activity  . Alcohol use: Not on file  . Drug use: Not on file  . Sexual activity: Not on file  Other Topics Concern  . Not on file  Social History Narrative  . Not on file    REVIEW OF SYSTEMS: Constitutional: No fevers, chills, or sweats, no generalized fatigue, change in appetite Eyes: No visual changes, double vision, eye pain Ear, nose and throat: No hearing loss, ear pain, nasal congestion, sore throat Cardiovascular: No chest pain, palpitations Respiratory:  No shortness of breath at rest or with exertion, wheezes GastrointestinaI: No nausea, vomiting,  diarrhea, abdominal pain, fecal incontinence Genitourinary:  No dysuria, urinary retention or frequency Musculoskeletal:  No neck pain, back pain Integumentary: No rash, pruritus, skin lesions Neurological: as above Psychiatric: No depression, insomnia, anxiety Endocrine: No palpitations, fatigue, diaphoresis, mood swings, change in appetite, change in weight, increased thirst Hematologic/Lymphatic:  No purpura, petechiae. Allergic/Immunologic: no itchy/runny eyes, nasal congestion, recent allergic reactions, rashes  PHYSICAL EXAM: Vitals:   06/13/17 1450  BP: 106/74  Pulse: 86  SpO2: 98%   General: No acute distress.  Patient appears well-groomed. . Head:  Normocephalic/atraumatic Eyes:  Fundi examined but not visualized Neck: supple, no paraspinal tenderness, full range of motion Heart:  Regular rate and rhythm Lungs:  Clear to auscultation bilaterally Back: No paraspinal tenderness Neurological Exam: alert and oriented to person, place, and time. Attention span and concentration intact, recent and remote memory intact, fund of knowledge intact.  Speech fluent and not dysarthric, language intact.  CN II-XII intact. Bulk and tone normal, muscle  strength 5/5 throughout.  Sensation to light touch  intact.  Deep tendon reflexes 2+ throughout.  Finger to nose testing intact.  Gait normal, Romberg negative.  IMPRESSION: Migraine.  Worse due to stress.  Hopefully, once she leaves current job they will improve.    PLAN: We will not make any changes to her topiramate as this may be situational (job-related).  We will see how she does once she starts her new job.  In meantime, I will prescribe her Zofran 4mg  ODT for nausea. Since triptans seem to cause burning sensation in her head, we will try an NSAID, Sprix NS. Follow up  Shon Millet, DO  CC:  Georgann Housekeeper, MD

## 2017-06-13 NOTE — Patient Instructions (Signed)
1.  Continue topiramate 100mg  at bedtime for now.  Migraines should improve once you leave your current job. 2.  When you get a migraine, try using the Sprix nasal spray (toradol).  1 spray in each nostril every 6-8 hours up to 4 times a day. 3.  Follow up in 4 months.

## 2017-06-14 ENCOUNTER — Encounter: Payer: Self-pay | Admitting: Neurology

## 2017-06-14 MED ORDER — ONDANSETRON 4 MG PO TBDP: 4.0000 mg | ORAL_TABLET | Freq: Three times a day (TID) | ORAL | 3 refills | Status: DC | PRN

## 2017-10-11 ENCOUNTER — Ambulatory Visit: Payer: 59 | Admitting: Neurology

## 2017-10-11 ENCOUNTER — Encounter

## 2017-10-27 ENCOUNTER — Other Ambulatory Visit: Payer: Self-pay

## 2017-10-27 ENCOUNTER — Emergency Department (INDEPENDENT_AMBULATORY_CARE_PROVIDER_SITE_OTHER)
Admission: EM | Admit: 2017-10-27 | Discharge: 2017-10-27 | Disposition: A | Discharge status: Home / Self Care | Payer: 59 | Source: Home / Self Care | Attending: Family Medicine | Admitting: Family Medicine

## 2017-10-27 DIAGNOSIS — K2100 Gastro-esophageal reflux disease with esophagitis, without bleeding: Secondary | ICD-10-CM

## 2017-10-27 DIAGNOSIS — K1121 Acute sialoadenitis: Secondary | ICD-10-CM

## 2017-10-27 MED ORDER — CLINDAMYCIN HCL 300 MG PO CAPS: 300.0000 mg | ORAL_CAPSULE | Freq: Three times a day (TID) | ORAL | 0 refills | Status: AC

## 2017-10-27 MED ORDER — ESOMEPRAZOLE MAGNESIUM 40 MG PO CPDR: 40.0000 mg | DELAYED_RELEASE_CAPSULE | Freq: Two times a day (BID) | ORAL | 1 refills | Status: DC

## 2017-10-27 NOTE — ED Triage Notes (Signed)
Pt stated that her sx started 4 days ago with while she was sleeping she had an episode of reflux, and feels like "the acid came up and went down in my lungs, and I woke up with it coming out of my nose."  Since then, still having reflux, having a hard time swallowing, and burning in her throat and chest.

## 2017-10-27 NOTE — Discharge Instructions (Addendum)
Increase fluid intake. Continue ranitidine 150mg  at bedtime. Apply heating pad or warm compress to right parotid gland for 10 to 15 minutes every 3 to 4 hours. Gently massage the swollen area. Suck on sour or lemon flavored hard candy.

## 2017-10-27 NOTE — ED Provider Notes (Signed)
[facility]    CSN: [id number] Arrival date & time: 10/27/17  1342     History   Chief Complaint Chief Complaint  Patient presents with  . Gastroesophageal Reflux    HPI Faith Moody is a 37 y.o. female.   Patient presents with two complaints: 1)  She has a long history of GERD, and presently takes omeprazole 40mg  daily and ranitidine 150mg  HS.  Four days ago she was awakened by a severe episode of burning reflux.  Since then she has had pain with swallowing, especially solid foods, and often must drink some liquid after swallowing solid food.  She has had nausea without vomiting.  Bowel movements have been normal.  She continues to have severe reflux symptoms at night, and must sleep upright against a pillow with "arms." Review of records reveals that she had similar (but less severe) symptoms in 2014.  An EGD done 04/18/13 at [facility] was negative, revealing normal mucosae of the esophagus, stomach, and duodenum.  She was diagnosed with non-erosive reflux with some functional dyspepsia, and started on Prilosec 40mg  QAM and ranitidine 150mg  HS.  She has not followed up with her gastroenterologist recently.  She has asthma, controlled at present.  2)  Patient noticed mildly painful swelling in front of her left ear yesterday morning without earache.  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  She denies fevers, chills, and sweats.    The history is provided by the patient.  Gastroesophageal Reflux  This is a chronic problem. The current episode started more than 2 days ago. The problem occurs daily. The problem has been gradually improving. Associated symptoms include chest pain and abdominal pain. Pertinent negatives include no headaches and no shortness of breath. The symptoms are aggravated by eating. Nothing relieves the symptoms. Treatments tried: omeprazole and ranitidine. The treatment provided mild relief.    Past Medical History:   Diagnosis Date  . Allergy   . Anxiety   . Asthma   . Depression   . Thyroid disease     Patient Active Problem List   Diagnosis Date Noted  . Premenstrual dysphoric disorder 09/04/2014    History reviewed. No pertinent surgical history.  OB History   None      Home Medications    Prior to Admission medications   Medication Sig Start Date End Date Taking? Authorizing Provider  albuterol (PROVENTIL, VENTOLIN) (5 MG/ML) 0.5% NEBU Take by nebulization continuous.    [provider]  budesonide-formoterol (SYMBICORT) 160-4.5 MCG/ACT inhaler Inhale 2 puffs into the lungs 2 (two) times daily.    [provider]  buPROPion (WELLBUTRIN XL) 150 MG 24 hr tablet Take 150 mg by mouth 2 (two) times daily.    [provider]  citalopram (CELEXA) 10 MG tablet TAKE 1/2 TABLET BY MOUTH FOR 1 WEEK THEN TAKE 1 TABLET BY MOUTH DAILY. 12/05/14   Ofilia Neaslark, Michael L, PA-C  clindamycin (CLEOCIN) 300 MG capsule Take 1 capsule (300 mg total) by mouth 3 (three) times daily for 10 days. Take one cap by mouth every 8 hours 10/27/17 11/06/17  Lattie HawBeese, Stephen A, MD  clonazePAM (KLONOPIN) 0.5 MG tablet Take 0.5 tablets (0.25 mg total) by mouth 2 (two) times daily as needed for anxiety. 09/04/14   Ofilia Neaslark, Michael L, PA-C  esomeprazole (NEXIUM) 40 MG capsule Take 1 capsule (40 mg total) by mouth 2 (two) times daily before a meal. 10/27/17   Beese, Tera MaterStephen A, MD  ibuprofen (ADVIL,MOTRIN) 800 MG tablet Take 1 tab every eight hours for pain Patient not taking: Reported on 02/09/2017 09/04/14   Ofilia Neas, PA-C  loratadine (CLARITIN) 10 MG tablet Take 10 mg by mouth daily.    [provider]  metFORMIN (GLUCOPHAGE) 1000 MG tablet Take 1,000 mg by mouth 2 (two) times daily with a meal.    [provider]  mometasone (NASONEX) 50 MCG/ACT nasal spray Place 2 sprays into the nose daily.    [provider]  omeprazole (PRILOSEC) 40 MG capsule Take 40 mg by mouth daily.     [provider]  ondansetron (ZOFRAN ODT) 4 MG disintegrating tablet Take 1 tablet (4 mg total) by mouth every 8 (eight) hours as needed for nausea or vomiting. 06/14/17   Drema Dallas, DO  ranitidine (ZANTAC) 150 MG capsule Take 150 mg by mouth 2 (two) times daily.    [provider]  SUMAtriptan (IMITREX) 100 MG tablet Take 1 tablet earliest onset of migraine.  May repeat once in 2 hours if headache persists or recurs. 02/09/17   Drema Dallas, DO  topiramate (TOPAMAX) 100 MG tablet Take 1 tablet (100 mg total) by mouth at bedtime. 02/09/17   Drema Dallas, DO    Family History Family History  Problem Relation Age of Onset  . Thyroid disease Mother   . Cancer Maternal Grandmother   . Hypertension Maternal Grandmother   . Hypertension Maternal Grandfather   . Cancer Maternal Grandfather   . Hypertension Paternal Grandmother   . Cancer Paternal Grandmother   . Hypertension Paternal Grandfather   . Cancer Paternal Grandfather     Social History Social History   Tobacco Use  . Smoking status: Never Smoker  . Smokeless tobacco: Never Used  Substance Use Topics  . Alcohol use: Not Currently    Alcohol/week: 0.0 oz    Frequency: Never  . Drug use: Not Currently     Allergies   Apple; Fish allergy; Peanuts [peanut oil]; Amoxicillin; Molds & smuts; Percocet [oxycodone-acetaminophen]; and Prednisone   Review of Systems Review of Systems  Constitutional: Negative for appetite change, chills, diaphoresis, fatigue and fever.  HENT: Positive for ear pain and trouble swallowing. Negative for congestion, drooling, ear discharge, facial swelling, mouth sores, rhinorrhea, sinus pain, sore throat and voice change.   Eyes: Negative.   Respiratory: Negative for chest tightness, shortness of breath and wheezing.   Cardiovascular: Positive for chest pain.  Gastrointestinal: Positive for abdominal pain and nausea. Negative for blood in stool, diarrhea and vomiting.    Genitourinary: Negative.   Musculoskeletal: Negative.   Skin: Negative.   Neurological: Negative for headaches.     Physical Exam Triage Vital Signs ED Triage Vitals  Enc Vitals Group     BP 10/27/17 1409 121/88     Pulse Rate 10/27/17 1409 (!) 112     Resp --      Temp 10/27/17 1409 98 F (36.7 C)     Temp Source 10/27/17 1409 Oral     SpO2 10/27/17 1409 95 %     Weight 10/27/17 1410 245 lb (111.1 kg)     Height 10/27/17 1410 5\' 4"  (1.626 m)     Head Circumference --      Peak Flow --      Pain Score 10/27/17 1410 6     Pain Loc --      Pain Edu? --      Excl. in GC? --  No data found.  Updated Vital Signs BP 121/88 (BP Location: Right Arm)   Pulse (!) 112   Temp 98 F (36.7 C) (Oral)   Ht 5\' 4"  (1.626 m)   Wt 245 lb (111.1 kg)   LMP 10/23/2017   SpO2 95%   BMI 42.05 kg/m   Visual Acuity Right Eye Distance:   Left Eye Distance:   Bilateral Distance:    Right Eye Near:   Left Eye Near:    Bilateral Near:     Physical Exam Nursing notes and Vital Signs reviewed. Appearance:  Patient appears stated age, and in no acute distress Eyes:  Pupils are equal, round, and reactive to light and accomodation.  Extraocular movement is intact.  Conjunctivae are not inflamed  Ears:  Canals normal.  Tympanic membranes normal.  There is distinct tenderness to palpation over the right parotid gland without swelling, warmth, or fluctuance. Nose:  Mildly congested turbinates.  No sinus tenderness.  Pharynx:  Normal Neck:  Supple.  No adenopathy. Lungs:  Clear to auscultation.  Breath sounds are equal.  Moving air well. Heart:  Regular rate and rhythm without murmurs, rubs, or gallops.  Abdomen:  Tenderness sub-xiphoid area without masses or hepatosplenomegaly.  Bowel sounds are present.  No CVA or flank tenderness.  Extremities:  No edema.  Skin:  No rash present.    UC Treatments / Results  Labs (all labs ordered are listed, but only abnormal results are  displayed) Labs Reviewed - No data to display  EKG None  Radiology No results found.  Procedures Procedures (including critical care time)  Medications Ordered in UC Medications - No data to display  Initial Impression / Assessment and Plan / UC Course  I have reviewed the triage vital signs and the nursing notes.  Pertinent labs & imaging results that were available during my care of the patient were reviewed by me and considered in my medical decision making (see chart for details).    For GERD and esophagitis, will stop omeprazole and begin Nexium 40mg  BID (continue Zantac 150mg  HS).  Advised to follow-up with gastroenterologist as soon as possible for further evaluation and treatment.  Patient will probably need repeat EGD. Discussed reflux precautions.   Parotitis does not appear to be suppurative.  Will begin Clindamycin 300mg  TID. Advised to follow-up with ENT as soon as possible.   Advised to proceed to ER is symptoms become worse during the night or weekend.   Final Clinical Impressions(s) / UC Diagnoses   Final diagnoses:  Gastroesophageal reflux disease with esophagitis  Parotitis, acute     Discharge Instructions     Increase fluid intake. Continue ranitidine 150mg  at bedtime. Apply heating pad or warm compress to right parotid gland for 10 to 15 minutes every 3 to 4 hours. Gently massage the swollen area. Suck on sour or lemon flavored hard candy.    ED Prescriptions    Medication Sig Dispense Auth. Provider   clindamycin (CLEOCIN) 300 MG capsule Take 1 capsule (300 mg total) by mouth 3 (three) times daily for 10 days. Take one cap by mouth every 8 hours 30 capsule Lattie Haw, MD   esomeprazole (NEXIUM) 40 MG capsule Take 1 capsule (40 mg total) by mouth 2 (two) times daily before a meal. 30 capsule Lattie Haw, MD         Lattie Haw, MD 10/27/17 1535

## 2017-11-02 ENCOUNTER — Other Ambulatory Visit: Payer: Self-pay

## 2017-11-02 MED ORDER — TOPIRAMATE 100 MG PO TABS: 100.0000 mg | ORAL_TABLET | Freq: Every day | ORAL | 0 refills | Status: DC

## 2017-12-15 ENCOUNTER — Ambulatory Visit: Payer: 59 | Admitting: Neurology

## 2017-12-20 ENCOUNTER — Encounter: Payer: Self-pay | Admitting: Neurology

## 2017-12-20 ENCOUNTER — Ambulatory Visit (INDEPENDENT_AMBULATORY_CARE_PROVIDER_SITE_OTHER): Payer: 59 | Admitting: Neurology

## 2017-12-20 VITALS — BP 104/76 | HR 81 | Ht 64.0 in | Wt 234.0 lb

## 2017-12-20 DIAGNOSIS — G450 Vertebro-basilar artery syndrome: Secondary | ICD-10-CM | POA: Diagnosis not present

## 2017-12-20 DIAGNOSIS — G43109 Migraine with aura, not intractable, without status migrainosus: Secondary | ICD-10-CM | POA: Diagnosis not present

## 2017-12-20 DIAGNOSIS — H543 Unqualified visual loss, both eyes: Secondary | ICD-10-CM

## 2017-12-20 DIAGNOSIS — G43709 Chronic migraine without aura, not intractable, without status migrainosus: Secondary | ICD-10-CM | POA: Diagnosis not present

## 2017-12-20 MED ORDER — DICLOFENAC POTASSIUM(MIGRAINE) 50 MG PO PACK: 50.0000 mg | PACK | ORAL | 3 refills | Status: DC

## 2017-12-20 MED ORDER — DICLOFENAC POTASSIUM(MIGRAINE) 50 MG PO PACK: 50.0000 mg | PACK | ORAL | 0 refills | Status: DC

## 2017-12-20 MED ORDER — ERENUMAB-AOOE 70 MG/ML ~~LOC~~ SOAJ: 70.0000 mg | SUBCUTANEOUS | 11 refills | Status: DC

## 2017-12-20 MED ORDER — ERENUMAB-AOOE 70 MG/ML ~~LOC~~ SOAJ: 70.0000 mg | Freq: Once | SUBCUTANEOUS | 0 refills | Status: AC

## 2017-12-20 NOTE — Patient Instructions (Addendum)
1.  We will start you on Aimovig 70mg  monthly injection (every 30 days) 2.  Continue topiramate 100mg  at bedtime 3.  When you get a migraine, take Cambia at the earliest onset as directed 4.  Limit pain relievers to no more than 2 days out of week to prevent rebound headache. 5.  Keep headache diary 6.  We will check MRI and MRA of head  7.  Follow up  We have [facility] a [facility] for your [facility]/MRA and they will call you directly to schedule your appt. They are located at [address] If you need to contact them directly please call [phone].

## 2017-12-20 NOTE — Progress Notes (Signed)
NEUROLOGY FOLLOW UP OFFICE NOTE  Faith AllegraBarbara Moody [id number]  HISTORY OF PRESENT ILLNESS: Faith AllegraBarbara Moody is a 37 year old female with asthma, anxiety and type 2 diabetes who follows up for migraine.   UPDATE: Migraine frequency not reduced despite taking both topiramate and nortriptyline.  She is having more frequent associated symptoms such as transient bilateral vision loss, vertigo and on one occasion acute paresthesias.  She was evaluated in the ED at [facility] on 07/09/17.  CT head was unremarkable. Intensity:  severe Duration:  1 day Frequency:  Every other day Frequency of abortive medication: none Current NSAIDS:  Sprix NS (ineffective) Current analgesics:  Excedrin (occasionally) Current triptans:  no Current anti-emetic:  Zofran ODT 4mg  Current muscle relaxants:  no Current anti-anxiolytic:  Klonopin Current sleep aide:  no Current Antihypertensive medications:  no Current Antidepressant medications:  Nortriptyline 100mg  Current Anticonvulsant medications:  topiramate 100mg  Current Vitamins/Herbal/Supplements:  no Current Antihistamines/Decongestants:  Claritin Other therapy:  no   Caffeine:  no Alcohol:  no Smoker:  no Diet:  hydrates Exercise:  yes Depression/anxiety:  Work-related stress.  However, she got another job and will be leaving current job in a couple of weeks. Sleep hygiene:  Has OSA and cannot tolerate CPAP.  However, she reports sleeping well   HISTORY:    Onset:  37 years old Location:  Left parietal region Quality:  pounding Initial Intensity:  8.5/10 Aura:  no Prodrome:  no Postdrome:  no Associated symptoms:  Nausea, photophobia, phonophobia, blurred vision, dizziness.  She does not endorse thunderclap headache or severe headache waking her from sleep. Initial Duration:  4 hours to all day Initial Frequency:  1 to 2 days per week Initial Frequency of abortive medication: 1 to 2 days per week Triggers/exacerbating factors:  no Relieving  factors:  no Activity:  aggravates   Past NSAIDS:  ibuprofen, Excedrin Past analgesics:  Tylenol, Excedrin Past abortive triptans:  Maxalt (increased headache), sumatriptan 100mg  Past muscle relaxants:  no Past anti-emetic:  no Past antihypertensive medications:  no Past antidepressant medications:  Celexa 5mg , bupropion Past anticonvulsant medications:  no Past vitamins/Herbal/Supplements:  no Past antihistamines/decongestants:  no Other past therapies:  no   Family history of headache:  Mom, grandmother  PAST MEDICAL HISTORY: Past Medical History:  Diagnosis Date  . Allergy   . Anxiety   . Asthma   . Depression   . Thyroid disease     MEDICATIONS: Current Outpatient Medications on File Prior to Visit  Medication Sig Dispense Refill  . albuterol (PROVENTIL, VENTOLIN) (5 MG/ML) 0.5% NEBU Take by nebulization continuous.    . budesonide-formoterol (SYMBICORT) 160-4.5 MCG/ACT inhaler Inhale 2 puffs into the lungs 2 (two) times daily.    Marland Kitchen. buPROPion (WELLBUTRIN XL) 150 MG 24 hr tablet Take 150 mg by mouth 2 (two) times daily.    . citalopram (CELEXA) 10 MG tablet TAKE 1/2 TABLET BY MOUTH FOR 1 WEEK THEN TAKE 1 TABLET BY MOUTH DAILY. (Patient not taking: Reported on 12/20/2017) 30 tablet 2  . clonazePAM (KLONOPIN) 0.5 MG tablet Take 0.5 tablets (0.25 mg total) by mouth 2 (two) times daily as needed for anxiety. 20 tablet 0  . esomeprazole (NEXIUM) 40 MG capsule Take 1 capsule (40 mg total) by mouth 2 (two) times daily before a meal. 30 capsule 1  . loratadine (CLARITIN) 10 MG tablet Take 10 mg by mouth daily.    . metFORMIN (GLUCOPHAGE) 1000 MG tablet Take 1,000 mg by mouth 2 (two) times daily with  a meal.    . mometasone (NASONEX) 50 MCG/ACT nasal spray Place 2 sprays into the nose daily.    . nortriptyline (PAMELOR) 50 MG capsule Take 100 mg by mouth at bedtime.    Marland Kitchen omeprazole (PRILOSEC) 40 MG capsule Take 40 mg by mouth daily.    . ondansetron (ZOFRAN ODT) 4 MG disintegrating  tablet Take 1 tablet (4 mg total) by mouth every 8 (eight) hours as needed for nausea or vomiting. 20 tablet 3  . ranitidine (ZANTAC) 150 MG capsule Take 150 mg by mouth 2 (two) times daily.    . SUMAtriptan (IMITREX) 100 MG tablet Take 1 tablet earliest onset of migraine.  May repeat once in 2 hours if headache persists or recurs. 10 tablet 2  . topiramate (TOPAMAX) 100 MG tablet Take 1 tablet (100 mg total) by mouth at bedtime. 90 tablet 2  . topiramate (TOPAMAX) 100 MG tablet Take 1 tablet (100 mg total) by mouth at bedtime. 90 tablet 0   No current facility-administered medications on file prior to visit.     ALLERGIES: Allergies  Allergen Reactions  . Apple Anaphylaxis  . Fish Allergy Anaphylaxis    All seafood  . Peanuts [Peanut Oil] Anaphylaxis    Tree nuts, All Nuts  . Amoxicillin Hives  . Molds & Smuts Hives  . Percocet [Oxycodone-Acetaminophen] Nausea And Vomiting  . Prednisone Other (See Comments)    Altered mental status    FAMILY HISTORY: Family History  Problem Relation Age of Onset  . Thyroid disease Mother   . Cancer Maternal Grandmother   . Hypertension Maternal Grandmother   . Hypertension Maternal Grandfather   . Cancer Maternal Grandfather   . Hypertension Paternal Grandmother   . Cancer Paternal Grandmother   . Hypertension Paternal Grandfather   . Cancer Paternal Grandfather     SOCIAL HISTORY: Social History   Socioeconomic History  . Marital status: Married    Spouse name: Not on file  . Number of children: Not on file  . Years of education: Not on file  . Highest education level: Not on file  Occupational History  . Not on file  Social Needs  . Financial resource strain: Not on file  . Food insecurity:    Worry: Not on file    Inability: Not on file  . Transportation needs:    Medical: Not on file    Non-medical: Not on file  Tobacco Use  . Smoking status: Never Smoker  . Smokeless tobacco: Never Used  Substance and Sexual Activity    . Alcohol use: Not Currently    Alcohol/week: 0.0 oz    Frequency: Never  . Drug use: Not Currently  . Sexual activity: Not on file  Lifestyle  . Physical activity:    Days per week: Not on file    Minutes per session: Not on file  . Stress: Not on file  Relationships  . Social connections:    Talks on phone: Not on file    Gets together: Not on file    Attends religious service: Not on file    Active member of club or organization: Not on file    Attends meetings of clubs or organizations: Not on file    Relationship status: Not on file  . Intimate partner violence:    Fear of current or ex partner: Not on file    Emotionally abused: Not on file    Physically abused: Not on file    Forced sexual  activity: Not on file  Other Topics Concern  . Not on file  Social History Narrative  . Not on file    REVIEW OF SYSTEMS: Constitutional: No fevers, chills, or sweats, no generalized fatigue, change in appetite Eyes: No visual changes, double vision, eye pain Ear, nose and throat: No hearing loss, ear pain, nasal congestion, sore throat Cardiovascular: No chest pain, palpitations Respiratory:  No shortness of breath at rest or with exertion, wheezes GastrointestinaI: IBS Genitourinary:  No dysuria, urinary retention or frequency Musculoskeletal:  No neck pain, back pain Integumentary: No rash, pruritus, skin lesions Neurological: as above Psychiatric: No depression, insomnia, anxiety Endocrine: No palpitations, fatigue, diaphoresis, mood swings, change in appetite, change in weight, increased thirst Hematologic/Lymphatic:  No purpura, petechiae. Allergic/Immunologic: no itchy/runny eyes, nasal congestion, recent allergic reactions, rashes  PHYSICAL EXAM: Vitals:   12/20/17 0801  BP: 104/76  Pulse: 81  SpO2: 98%   General: No acute distress.  Patient appears well-groomed.   Head:  Normocephalic/atraumatic Eyes:  Fundi examined but not visualized Neck: supple, no  paraspinal tenderness, full range of motion Heart:  Regular rate and rhythm Lungs:  Clear to auscultation bilaterally Back: No paraspinal tenderness Neurological Exam: alert and oriented to person, place, and time. Attention span and concentration intact, recent and remote memory intact, fund of knowledge intact.  Speech fluent and not dysarthric, language intact.  CN II-XII intact. Bulk and tone normal, muscle strength 5/5 throughout.  Sensation to light touch  intact.  Deep tendon reflexes 2+ throughout.  Finger to nose testing intact.  Gait normal, Romberg negative.  IMPRESSION: Chronic migraines with and without aura, not intractable, without status migrainosus. New symptoms of vertigo and transient black out of vision suspicious for basilar migraines.    PLAN: 1.  Given the new basilar symptoms, check MRI and MRA of head 2.  Start Aimovig 70mg  monthly,  Continue topiramate 100mg  at bedtime for now. 3.  Given the new basilar symptoms, I would like to avoid triptans for now.  Will try Cambia. 4.  Limit pain relievers to no more than 2 days out of week to prevent rebound headache 5.  Headache diary 6.  Follow up in 3 to 4 months.   Shon Millet, DO  CC:  Dr. Donette Larry

## 2017-12-23 ENCOUNTER — Telehealth: Payer: Self-pay | Admitting: Neurology

## 2017-12-23 NOTE — Telephone Encounter (Signed)
Ellen called and left a voicemail from [facility] stating that if she didn't receive the prior Auth on this patient today 8/9 that she would have to cancelled the scheduled MRIs for tomorrow. We haven't heard back from Esmerelda about this. Thanks.  °

## 2017-12-25 ENCOUNTER — Ambulatory Visit
Admission: RE | Admit: 2017-12-25 | Discharge: 2017-12-25 | Disposition: A | Discharge status: Home / Self Care | Payer: 59 | Source: Ambulatory Visit | Attending: Neurology | Admitting: Neurology

## 2017-12-25 DIAGNOSIS — H543 Unqualified visual loss, both eyes: Secondary | ICD-10-CM

## 2017-12-25 DIAGNOSIS — G43109 Migraine with aura, not intractable, without status migrainosus: Secondary | ICD-10-CM

## 2017-12-25 DIAGNOSIS — G450 Vertebro-basilar artery syndrome: Secondary | ICD-10-CM

## 2017-12-29 ENCOUNTER — Telehealth: Payer: Self-pay

## 2017-12-29 NOTE — Telephone Encounter (Signed)
Called and spoke with Pt, advised her of MRI/MRA results

## 2017-12-29 NOTE — Telephone Encounter (Signed)
-----   Message from Drema DallasAdam R Jaffe, DO sent at 12/26/2017 11:44 AM EDT ----- MRI of brain reveals no stroke, tumor or bleed.  MRA shows no narrowing of the blood vessels that would be causing symptoms such as vision loss.  There are a couple of incidental findings.  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  It requires no intervention, but I would like to repeat CTA of head in one year to follow up for any change.  It also shows a mildly enlarged lymph node which may suggest some kind of inflammatory condition such as a cold.  She should monitor for such symptoms.

## 2018-01-10 DIAGNOSIS — K625 Hemorrhage of anus and rectum: Secondary | ICD-10-CM | POA: Insufficient documentation

## 2018-01-31 ENCOUNTER — Ambulatory Visit: Payer: Self-pay | Admitting: Neurology

## 2018-01-31 ENCOUNTER — Encounter

## 2018-02-17 ENCOUNTER — Telehealth: Payer: Self-pay | Admitting: Neurology

## 2018-02-17 MED ORDER — TOPIRAMATE 100 MG PO TABS: 100.0000 mg | ORAL_TABLET | Freq: Every day | ORAL | 3 refills | Status: DC

## 2018-02-17 NOTE — Telephone Encounter (Signed)
Pt states that we called in the topamax to the wrong pharmacy she uses the [facility] in [place] now and would like [place] to call it in there

## 2018-03-21 NOTE — Progress Notes (Signed)
NEUROLOGY FOLLOW UP OFFICE NOTE  Faith Moody [id number]  HISTORY OF PRESENT ILLNESS: Faith Moody is a 37 year old female with asthma, anxiety and type 2 diabetes who follows up for migraine.  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  At work the fluorescent light aggravates or triggers her migraines.  Intensity:  8/10 Duration:  A few hours Frequency:  Once a week Current NSAIDS:  none Does not take pain relievers at work because it causes fatigue. Current analgesics: Excedrin Current triptans: None Current ergotamine: None Current anti-emetic: Zofran ODT 4 mg Current muscle relaxants: None Current anti-anxiolytic: Klonopin Current sleep aide: None Current Antihypertensive medications: None Current Antidepressant medications: Nortriptyline 100 mg (for anxiety) Current Anticonvulsant medications: Topiramate 100 mg Current anti-CGRP: Aimovig 70 mg Current Vitamins/Herbal/Supplements: None Current Antihistamines/Decongestants: Claritin Other therapy: None Hormone/birth control:  None  Caffeine: No Hydration: Yes Exercise: Yes Depression: No; Anxiety:  Yes Other pain: No Sleep hygiene: Has OSA and cannot tolerate CPAP.  However, she reports sleeping well.  HISTORY:  Onset: 37 years old Location:  Left parietal region Quality:  pounding Initial Intensity:  8.5/10 Aura:  no Prodrome:  no Postdrome:  no Associated symptoms: Nausea, photophobia, phonophobia, blurred vision, transient bilateral vision loss.  On one occasion, paresthesias.  She does not endorse thunderclap headache or severe headache waking her from sleep. Initial  Duration:  4 hours to all day Initial Frequency:  1 to 2 days per week Initial Frequency of abortive medication: 1 to 2 days per week Triggers: None Relieving factors:  no Activity:  aggravates  Past NSAIDS:  ibuprofen, Excedrin, Sprix nasal spray Past analgesics:  Tylenol, Excedrin Past abortive triptans:  Maxalt (increased headache), sumatriptan 100mg  Past muscle relaxants:  no Past anti-emetic:  no Past antihypertensive medications:  no Past antidepressant medications:  Celexa 5mg , bupropion Past anticonvulsant medications:  no Past vitamins/Herbal/Supplements:  no Past antihistamines/decongestants:  no Other past therapies:  no  Family history of headache:  Mom, grandmother  PAST MEDICAL HISTORY: Past Medical History:  Diagnosis Date  . Allergy   . Anxiety   . Asthma   . Depression   . Thyroid disease     MEDICATIONS: Current Outpatient Medications on File Prior to Visit  Medication Sig Dispense Refill  . albuterol (PROVENTIL, VENTOLIN) (5 MG/ML) 0.5% NEBU Take by nebulization continuous.    . budesonide-formoterol (SYMBICORT) 160-4.5 MCG/ACT inhaler Inhale 2 puffs into the lungs 2 (two) times daily.    Marland Kitchen buPROPion (WELLBUTRIN XL) 150 MG 24 hr tablet Take 150 mg by mouth 2 (two) times daily.    . citalopram (CELEXA) 10 MG tablet TAKE 1/2 TABLET BY MOUTH FOR 1 WEEK THEN TAKE 1 TABLET BY MOUTH DAILY. (Patient not taking: Reported on 12/20/2017) 30 tablet 2  . clonazePAM (KLONOPIN) 0.5 MG tablet Take 0.5 tablets (0.25 mg total) by mouth 2 (two) times daily as needed for anxiety. 20 tablet 0  . Diclofenac Potassium (CAMBIA) 50 MG PACK Take 50 mg by mouth as directed. 9 each 3  . Diclofenac Potassium (CAMBIA) 50 MG PACK Take 50 mg by mouth as directed. 2 each 0  . Erenumab-aooe (AIMOVIG) 70 MG/ML SOAJ Inject 70 mg into the skin  every 30 (thirty) days. 1 pen 11  . esomeprazole (NEXIUM) 40 MG capsule Take 1 capsule (40 mg total) by mouth 2 (two) times daily before a meal. 30  capsule 1  . loratadine (CLARITIN) 10 MG tablet Take 10 mg by mouth daily.    . metFORMIN (GLUCOPHAGE) 1000 MG tablet Take 1,000 mg by mouth 2 (two) times daily with a meal.    . mometasone (NASONEX) 50 MCG/ACT nasal spray Place 2 sprays into the nose daily.    . nortriptyline (PAMELOR) 50 MG capsule Take 100 mg by mouth at bedtime.    Marland Kitchen omeprazole (PRILOSEC) 40 MG capsule Take 40 mg by mouth daily.    . ondansetron (ZOFRAN ODT) 4 MG disintegrating tablet Take 1 tablet (4 mg total) by mouth every 8 (eight) hours as needed for nausea or vomiting. 20 tablet 3  . ranitidine (ZANTAC) 150 MG capsule Take 150 mg by mouth 2 (two) times daily.    . SUMAtriptan (IMITREX) 100 MG tablet Take 1 tablet earliest onset of migraine.  May repeat once in 2 hours if headache persists or recurs. 10 tablet 2  . topiramate (TOPAMAX) 100 MG tablet Take 1 tablet (100 mg total) by mouth at bedtime. 90 tablet 2  . topiramate (TOPAMAX) 100 MG tablet Take 1 tablet (100 mg total) by mouth at bedtime. 90 tablet 0  . topiramate (TOPAMAX) 100 MG tablet Take 1 tablet (100 mg total) by mouth at bedtime. 30 tablet 3   No current facility-administered medications on file prior to visit.     ALLERGIES: Allergies  Allergen Reactions  . Apple Anaphylaxis  . Fish Allergy Anaphylaxis    All seafood  . Peanuts [Peanut Oil] Anaphylaxis    Tree nuts, All Nuts  . Amoxicillin Hives  . Molds & Smuts Hives  . Percocet [Oxycodone-Acetaminophen] Nausea And Vomiting  . Prednisone Other (See Comments)    Altered mental status    FAMILY HISTORY: Family History  Problem Relation Age of Onset  . Thyroid disease Mother   . Cancer Maternal Grandmother   . Hypertension Maternal Grandmother   . Hypertension Maternal Grandfather   . Cancer Maternal Grandfather   . Hypertension Paternal Grandmother   . Cancer Paternal Grandmother   . Hypertension Paternal Grandfather   . Cancer Paternal Grandfather     SOCIAL HISTORY: Social  History   Socioeconomic History  . Marital status: Married    Spouse name: Not on file  . Number of children: Not on file  . Years of education: Not on file  . Highest education level: Not on file  Occupational History  . Not on file  Social Needs  . Financial resource strain: Not on file  . Food insecurity:    Worry: Not on file    Inability: Not on file  . Transportation needs:    Medical: Not on file    Non-medical: Not on file  Tobacco Use  . Smoking status: Never Smoker  . Smokeless tobacco: Never Used  Substance and Sexual Activity  . Alcohol use: Not Currently    Alcohol/week: 0.0 standard drinks    Frequency: Never  . Drug use: Not Currently  . Sexual activity: Not on file  Lifestyle  . Physical activity:    Days per week: Not on file    Minutes per session: Not on file  . Stress: Not on file  Relationships  . Social connections:    Talks on phone: Not on file  Gets together: Not on file    Attends religious service: Not on file    Active member of club or organization: Not on file    Attends meetings of clubs or organizations: Not on file    Relationship status: Not on file  . Intimate partner violence:    Fear of current or ex partner: Not on file    Emotionally abused: Not on file    Physically abused: Not on file    Forced sexual activity: Not on file  Other Topics Concern  . Not on file  Social History Narrative  . Not on file    REVIEW OF SYSTEMS: Constitutional: No fevers, chills, or sweats, no generalized fatigue, change in appetite Eyes: No visual changes, double vision, eye pain Ear, nose and throat: No hearing loss, ear pain, nasal congestion, sore throat Cardiovascular: No chest pain, palpitations Respiratory:  No shortness of breath at rest or with exertion, wheezes GastrointestinaI: No nausea, vomiting, diarrhea, abdominal pain, fecal incontinence Genitourinary:  No dysuria, urinary retention or frequency Musculoskeletal:  No neck  pain, back pain Integumentary: No rash, pruritus, skin lesions Neurological: as above Psychiatric: No depression, insomnia, anxiety Endocrine: No palpitations, fatigue, diaphoresis, mood swings, change in appetite, change in weight, increased thirst Hematologic/Lymphatic:  No purpura, petechiae. Allergic/Immunologic: no itchy/runny eyes, nasal congestion, recent allergic reactions, rashes  PHYSICAL EXAM: Blood pressure 110/80, pulse 80, height 5\' 4"  (1.626 m), weight 247 lb (112 kg). General: No acute distress.  Patient appears well-groomed.  Head:  Normocephalic/atraumatic Eyes:  Fundi examined but not visualized Neck: supple, no paraspinal tenderness, full range of motion Heart:  Regular rate and rhythm Lungs:  Clear to auscultation bilaterally Back: No paraspinal tenderness Neurological Exam: alert and oriented to person, place, and time. Attention span and concentration intact, recent and remote memory intact, fund of knowledge intact.  Speech fluent and not dysarthric, language intact.  CN II-XII intact. Bulk and tone normal, muscle strength 5/5 throughout.  Sensation to light touch, temperature and vibration intact.  Deep tendon reflexes 2+ throughout, toes downgoing.  Finger to nose and heel to shin testing intact.  Gait normal, Romberg negative.  IMPRESSION: 1.  Migraine without aura, without status migrainosus, not intractable 2.  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  For preventative management, continue Aimovig 70mg  monthly and topiramate 100mg  at bedtime 2.  For abortive therapy, Excedrin and Zofran as needed. 3.  Limit use of pain relievers to no more than 2 days out of week to prevent risk of rebound or medication-overuse headache. 4.  Keep headache diary 5.  Exercise, hydration, caffeine cessation, sleep hygiene, monitor for and avoid triggers 6.  Consider:  magnesium citrate 400mg  daily, riboflavin 400mg  daily, and coenzyme Q10 100mg  three times daily 7. Repeat a  CTA of head in 1 year for follow up.  8. Follow up 5 months   Shon Millet, DO

## 2018-03-22 ENCOUNTER — Ambulatory Visit (INDEPENDENT_AMBULATORY_CARE_PROVIDER_SITE_OTHER): Payer: 59 | Admitting: Neurology

## 2018-03-22 ENCOUNTER — Encounter: Payer: Self-pay | Admitting: Neurology

## 2018-03-22 VITALS — BP 110/80 | HR 80 | Ht 64.0 in | Wt 247.0 lb

## 2018-03-22 DIAGNOSIS — G43009 Migraine without aura, not intractable, without status migrainosus: Secondary | ICD-10-CM | POA: Diagnosis not present

## 2018-03-22 NOTE — Patient Instructions (Signed)
1.  Continue Aimovig 70mg  monthly and topiramate 100mg  at bedtime 2.  Limit use of pain relievers to no more than 2 days out of week to prevent risk of rebound or medication-overuse headache. 3.  Keep headache diary 4.  Exercise, hydrate, practice proper sleep hygiene 5.  Follow up in 5 months.

## 2018-04-26 DIAGNOSIS — E282 Polycystic ovarian syndrome: Secondary | ICD-10-CM | POA: Insufficient documentation

## 2018-07-03 ENCOUNTER — Other Ambulatory Visit: Payer: Self-pay | Admitting: Neurology

## 2018-08-15 NOTE — Progress Notes (Addendum)
Virtual Visit via Video Note The purpose of this virtual visit is to provide medical care while limiting exposure to the novel coronavirus.    Consent was obtained for video visit:  Yes.   Answered questions that patient had about telehealth interaction:  Yes.   I discussed the limitations, risks, security and privacy concerns of performing an evaluation and management service by telemedicine. I also discussed with the patient that there may be a patient responsible charge related to this service. The patient expressed understanding and agreed to proceed.  Pt location: Home Physician Location: office Name of referring provider:  Georgann Housekeeper, MD I connected with Faith Moody at patients initiation/request on 08/17/2018 at  9:00 AM EDT by video enabled telemedicine application and verified that I am speaking with the correct person using two identifiers. [name]:  [id number] [name]:  23-Jun-1980   History of Present Illness:  Faith Moody is a 38 year old female with asthma, anxiety and type 2 diabetes who follows up for migraine.  UPDATE: Aimovig is effective, but she was unable to take the last shot due to having fell into the doughnut hole and couldn't afford it.  Over the past month, headaches have been bad.  Sometimes with increased dizziness.  Trying to avoid taking Excedrin. Intensity:  8/10 Duration:  A few hours Frequency:  daily Current NSAIDS:  none Does not take pain relievers at work because it causes fatigue. Current analgesics: Excedrin  Current triptans: None Current ergotamine: None Current anti-emetic: Zofran ODT 4 mg Current muscle relaxants: None Current anti-anxiolytic: Klonopin Current sleep aide: None Current Antihypertensive medications: None Current Antidepressant medications: none Current Anticonvulsant medications: Topiramate 100 mg Current anti-CGRP: Aimovig 70 mg Current Vitamins/Herbal/Supplements: None Current  Antihistamines/Decongestants: Claritin Other therapy: None Hormone/birth control:  None  Caffeine: No Hydration: Yes Exercise: Yes Depression: No; Anxiety:  Yes Other pain: No Sleep hygiene: Has OSA and cannot tolerate CPAP.  However, she reports sleeping well.  HISTORY:  Onset: 38 years old Location: Left parietal region Quality: pounding Initial Intensity: 8.5/10 Aura: no Prodrome: no Postdrome: no Associated symptoms: Nausea, photophobia, phonophobia, blurred vision, transient bilateral vision loss.  On one occasion, paresthesias. She does not endorse thunderclap headache or severe headache waking her from sleep. Initial Duration: 4 hours to all day Initial Frequency: 1 to 2 days per week Initial Frequency of abortive medication: 1 to 2 days per week Triggers: fluorescent light  Relieving factors: no Activity: aggravates  Past NSAIDS: ibuprofen, Sprix nasal spray, Cambia (effective but did not like the taste) Past analgesics: Tylenol, Excedrin Past abortive triptans: Maxalt (increased headache), sumatriptan  Past muscle relaxants: no Past anti-emetic: no Past antihypertensive medications: no Past antidepressant medications: nortriptyline , Celexa , bupropion Past anticonvulsant medications: no Past vitamins/Herbal/Supplements: no Past antihistamines/decongestants: no Other past therapies: no  Family history of headache: Mom, grandmother  Due to more frequent associated symptoms such as transient bilateral vision loss, vertigo and on one occasion acute paresthesias, she had an MRI and MRA of the head on 12/25/2017, which was personally reviewed and was negative for any secondary etiology of symptoms but 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  Past Medical History: Past Medical History:  Diagnosis Date  . Allergy   . Anxiety   . Asthma   . Depression   . Thyroid  disease    Medications: Outpatient Encounter Medications as of 08/17/2018  Medication Sig  . Albuterol Sulfate (PROAIR RESPICLICK IN) Inhale  into the lungs.  . budesonide-formoterol (SYMBICORT) 160-4.5 MCG/ACT inhaler Inhale 2 puffs into the lungs 2 (two) times daily.  . clonazePAM (KLONOPIN) 0.5 MG tablet Take 0.5 tablets (0.25 mg total) by mouth 2 (two) times daily as needed for anxiety.  Dorise Hiss (AIMOVIG) 70 MG/ML SOAJ Inject 70 mg into the skin every 30 (thirty) days.  Marylin Crosby Succinate (REYVOW) 100 MG TABS Take 100 mg by mouth as needed (Maximum 1 tablet in 24 hours).  . loratadine (CLARITIN) 10 MG tablet Take 10 mg by mouth daily.  . mometasone (NASONEX) 50 MCG/ACT nasal spray Place 2 sprays into the nose daily.  . ondansetron (ZOFRAN ODT) 4 MG disintegrating tablet Take 1 tablet (4 mg total) by mouth every 8 (eight) hours as needed for nausea or vomiting.  . ranitidine (ZANTAC) 150 MG capsule Take 150 mg by mouth 2 (two) times daily.  Marland Kitchen topiramate (TOPAMAX) 100 MG tablet Take 1 tablet (100 mg total) by mouth at bedtime.  . [DISCONTINUED] albuterol (PROVENTIL, VENTOLIN) (5 MG/ML) 0.5% NEBU Take by nebulization continuous.  . [DISCONTINUED] buPROPion (WELLBUTRIN XL) 150 MG 24 hr tablet Take 150 mg by mouth 2 (two) times daily.  . [DISCONTINUED] citalopram (CELEXA) 10 MG tablet TAKE 1/2 TABLET BY MOUTH FOR 1 WEEK THEN TAKE 1 TABLET BY MOUTH DAILY. (Patient not taking: Reported on 12/20/2017)  . [DISCONTINUED] Diclofenac Potassium (CAMBIA) 50 MG PACK Take 50 mg by mouth as directed.  . [DISCONTINUED] Diclofenac Potassium (CAMBIA) 50 MG PACK Take 50 mg by mouth as directed.  . [DISCONTINUED] Erenumab-aooe (AIMOVIG) 70 MG/ML SOAJ Inject 70 mg into the skin every 30 (thirty) days.  . [DISCONTINUED] esomeprazole (NEXIUM) 40 MG capsule Take 1 capsule (40 mg total) by mouth 2 (two) times daily before a meal.  . [DISCONTINUED] metFORMIN (GLUCOPHAGE) 1000 MG tablet Take 1,000 mg by mouth 2  (two) times daily with a meal.  . [DISCONTINUED] nortriptyline (PAMELOR) 50 MG capsule Take 100 mg by mouth at bedtime.  . [DISCONTINUED] omeprazole (PRILOSEC) 40 MG capsule Take 40 mg by mouth daily.  . [DISCONTINUED] ondansetron (ZOFRAN ODT) 4 MG disintegrating tablet Take 1 tablet (4 mg total) by mouth every 8 (eight) hours as needed for nausea or vomiting.  . [DISCONTINUED] SUMAtriptan (IMITREX) 100 MG tablet Take 1 tablet earliest onset of migraine.  May repeat once in 2 hours if headache persists or recurs.  . [DISCONTINUED] topiramate (TOPAMAX) 100 MG tablet Take 1 tablet (100 mg total) by mouth at bedtime.  . [DISCONTINUED] topiramate (TOPAMAX) 100 MG tablet Take 1 tablet (100 mg total) by mouth at bedtime.  . [DISCONTINUED] topiramate (TOPAMAX) 100 MG tablet TAKE ONE TABLET BY MOUTH EVERY NIGHT AT BEDTIME   No facility-administered encounter medications on file as of 08/17/2018.     Allergies: Allergies  Allergen Reactions  . Apple Anaphylaxis  . Fish Allergy Anaphylaxis    All seafood  . Peanuts [Peanut Oil] Anaphylaxis    Tree nuts, All Nuts  . Amoxicillin Hives  . Molds & Smuts Hives  . Percocet [Oxycodone-Acetaminophen] Nausea And Vomiting  . Prednisone Other (See Comments)    Altered mental status    Family History: Family History  Problem Relation Age of Onset  . Thyroid disease Mother   . Cancer Maternal Grandmother   . Hypertension Maternal Grandmother   . Hypertension Maternal Grandfather   . Cancer Maternal Grandfather   . Hypertension Paternal Grandmother   . Cancer Paternal Grandmother   . Hypertension Paternal Grandfather   .  Cancer Paternal Grandfather    Social History: Social History   Socioeconomic History  . Marital status: Married    Spouse name: Not on file  . Number of children: Not on file  . Years of education: Not on file  . Highest education level: Not on file  Occupational History  . Not on file  Social Needs  . Financial resource  strain: Not on file  . Food insecurity:    Worry: Not on file    Inability: Not on file  . Transportation needs:    Medical: Not on file    Non-medical: Not on file  Tobacco Use  . Smoking status: Never Smoker  . Smokeless tobacco: Never Used  Substance and Sexual Activity  . Alcohol use: Not Currently    Alcohol/week: 0.0 standard drinks    Frequency: Never  . Drug use: Not Currently  . Sexual activity: Not on file  Lifestyle  . Physical activity:    Days per week: Not on file    Minutes per session: Not on file  . Stress: Not on file  Relationships  . Social connections:    Talks on phone: Not on file    Gets together: Not on file    Attends religious service: Not on file    Active member of club or organization: Not on file    Attends meetings of clubs or organizations: Not on file    Relationship status: Not on file  . Intimate partner violence:    Fear of current or ex partner: Not on file    Emotionally abused: Not on file    Physically abused: Not on file    Forced sexual activity: Not on file  Other Topics Concern  . Not on file  Social History Narrative  . Not on file    Review Of Systems: Review of Systems  Constitutional: Negative for chills, diaphoresis, fever and malaise/fatigue.  HENT: Negative for congestion, ear discharge, ear pain, hearing loss and sore throat.   Eyes: Negative for blurred vision, double vision, pain, discharge and redness.  Respiratory: Negative for cough and shortness of breath.   Cardiovascular: Negative for chest pain, palpitations and orthopnea.  Gastrointestinal: Negative for abdominal pain, constipation, diarrhea, nausea and vomiting.  Genitourinary: Negative for dysuria.  Musculoskeletal: Negative for myalgias.  Skin: Negative for itching and rash.  Neurological: Negative for tingling, tremors, sensory change, speech change, focal weakness and seizures.  Endo/Heme/Allergies: Negative for environmental allergies. Does not  bruise/bleed easily.  Psychiatric/Behavioral: Negative for depression and hallucinations. The patient is nervous/anxious.     Observations/Objective:   Pulse 86, temperature (!) 97.3 F (36.3 C), height 5\' 4"  (1.626 m), weight 233 lb (105.7 kg). alert and oriented to person, place, and time. Attention span and concentration intact, recent and remote memory intact, fund of knowledge intact.  Speech fluent and not dysarthric, language intact. Pupils equal and round.  Moves eyes in all directions.  Face symmetric.   Assessment and Plan:   1.  Migraine without aura, without status migrainosus, not intractable, worse over the past month due to inability to afford last dose. 2.  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  For preventative management, refilled Aimovig 70mg  and topiramate 100mg  2.  For abortive therapy, she will try Reyvow 100mg .  Refilled Zofran as well. 3.  Limit use of pain relievers to no more than 2 days out of week to prevent risk of rebound or medication-overuse headache. 4.  Keep headache diary  5.  Exercise, hydration, caffeine cessation, sleep hygiene, monitor for and avoid triggers 6.  Check CTA of head in 6 months to follow up on tiny PCOM aneurysm vs infundibulum 7.  Follow up in 6 months (after repeat CTA of head)  Follow Up Instructions:    -I discussed the assessment and treatment plan with the patient. The patient was provided an opportunity to ask questions and all were answered. The patient agreed with the plan and demonstrated an understanding of the instructions.   The patient was advised to call back or seek an in-person evaluation if the symptoms worsen or if the condition fails to improve as anticipated.   Cira Servant, DO

## 2018-08-17 ENCOUNTER — Other Ambulatory Visit: Payer: Self-pay

## 2018-08-17 ENCOUNTER — Encounter: Payer: Self-pay | Admitting: Neurology

## 2018-08-17 ENCOUNTER — Telehealth (INDEPENDENT_AMBULATORY_CARE_PROVIDER_SITE_OTHER): Payer: Managed Care, Other (non HMO) | Admitting: Neurology

## 2018-08-17 DIAGNOSIS — G43709 Chronic migraine without aura, not intractable, without status migrainosus: Secondary | ICD-10-CM | POA: Diagnosis not present

## 2018-08-17 MED ORDER — ONDANSETRON 4 MG PO TBDP: 4.0000 mg | ORAL_TABLET | Freq: Three times a day (TID) | ORAL | 6 refills | Status: DC | PRN

## 2018-08-17 MED ORDER — ERENUMAB-AOOE 70 MG/ML ~~LOC~~ SOAJ: 70.0000 mg | SUBCUTANEOUS | 6 refills | Status: AC

## 2018-08-17 MED ORDER — TOPIRAMATE 100 MG PO TABS: 100.0000 mg | ORAL_TABLET | Freq: Every day | ORAL | 6 refills | Status: DC

## 2018-08-17 MED ORDER — LASMIDITAN SUCCINATE 100 MG PO TABS: 100.0000 mg | ORAL_TABLET | ORAL | 5 refills | Status: DC | PRN

## 2018-08-21 ENCOUNTER — Ambulatory Visit: Payer: 59 | Admitting: Neurology

## 2018-08-23 ENCOUNTER — Telehealth: Payer: Self-pay

## 2018-08-23 DIAGNOSIS — I671 Cerebral aneurysm, nonruptured: Secondary | ICD-10-CM

## 2018-08-23 NOTE — Telephone Encounter (Signed)
Called and advised Pt 

## 2018-08-23 NOTE — Telephone Encounter (Signed)
-----   Message from Drema Dallas, DO sent at 08/17/2018  9:55 AM EDT ----- CTA of head in 6 months (prior to follow up) for cerebral aneurysm

## 2018-09-01 ENCOUNTER — Encounter: Payer: Self-pay | Admitting: *Deleted

## 2018-09-01 NOTE — Progress Notes (Signed)
Faith Moody (Faith Moody)  Rx #: [id number]  Aimovig 70MG /ML auto-injectors  Form [employer] Aimovig Form  Plan Contact [phone] phone  [phone] fax  Created  9 days ago  Downloaded  5 hours ago  Sent to Plan  5 hours ago  Determination  Wait for Determination Please wait for to return a determination.

## 2018-09-14 NOTE — Progress Notes (Signed)
Rcvd faxed approval from [facility] Approved 09/12/18 - 09/12/19  For future questions, call PA department at [phone] F#[phone]  Called and LMOVM advising Aimovig 70 mg has been approved and approval dates.

## 2018-09-20 ENCOUNTER — Telehealth: Payer: Self-pay | Admitting: Neurology

## 2018-09-20 NOTE — Telephone Encounter (Signed)
Patient called regarding her New Migraine medication Reyvow. She said she does not like it. It's making all her Limbs Numb. She is wanting to try something new. Please Call. Thanks

## 2018-09-21 MED ORDER — UBROGEPANT 100 MG PO TABS: 100.0000 mg | ORAL_TABLET | ORAL | 3 refills | Status: DC | PRN

## 2018-09-21 NOTE — Telephone Encounter (Signed)
Ubrelvy 100mg .  May repeat dose once after 2 hours if needed

## 2018-09-21 NOTE — Telephone Encounter (Signed)
Called and LMOVM advising Pt of Ubrelvy and dosing.

## 2018-10-11 NOTE — Progress Notes (Signed)
Initiated on cover my meds Bryonna Mowdy (Key: [id number])  Rx #: [id number]  Bernita Raisin 100MG  tablets  Form Alphia Moh Form  Plan Contact [phone] phone  [phone] fax

## 2018-10-26 NOTE — Progress Notes (Signed)
Faith Moody denied  Rcvd Appeal request from Cover My MedsKey: [id number] Waiting on determination

## 2018-11-16 NOTE — Progress Notes (Signed)
Cover my meds started for Ubrelvy 100MG  TAB  Key [id number]

## 2018-11-30 NOTE — Progress Notes (Signed)
Clinicals faxed to [phone] for Ubrelvy prior auth.

## 2018-12-14 NOTE — Progress Notes (Signed)
Completing request again on Cover My Meds Key: [id number]    faxed within their system #[phone]

## 2019-02-09 ENCOUNTER — Ambulatory Visit
Admission: RE | Admit: 2019-02-09 | Discharge: 2019-02-09 | Disposition: A | Discharge status: Home / Self Care | Payer: Managed Care, Other (non HMO) | Source: Ambulatory Visit | Attending: Neurology | Admitting: Neurology

## 2019-02-09 DIAGNOSIS — I671 Cerebral aneurysm, nonruptured: Secondary | ICD-10-CM

## 2019-02-09 IMAGING — CT CT ANGIO HEAD
2 of 4 series · 6 of 30 positions shown · IV contrast (APPLIED)
Comparison: MRI/MRA head 12/25/2017

CLINICAL DATA: Nonruptured cerebral aneurysm. Follow-up aneurysm,
cerebral aneurysm undulated, asymptomatic, follow up.

EXAM:
CT ANGIOGRAPHY HEAD
TECHNIQUE: Multidetector CT imaging of the head was performed using the
standard protocol during bolus administration of intravenous
contrast. Multiplanar CT image reconstructions and MIPs were
obtained to evaluate the vascular anatomy.
CONTRAST:  75mL VKB9HC-U6W IOPAMIDOL (VKB9HC-U6W) INJECTION 76%

[Series 4: head w/(date) · axial · 0.41mm/px · z∈[-116,-61]mm · 2 of 33 slices shown]
[im 11/33  brain]
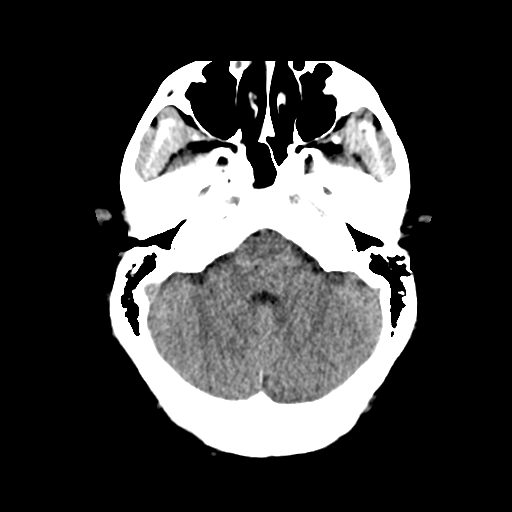
[im 22/33  brain]
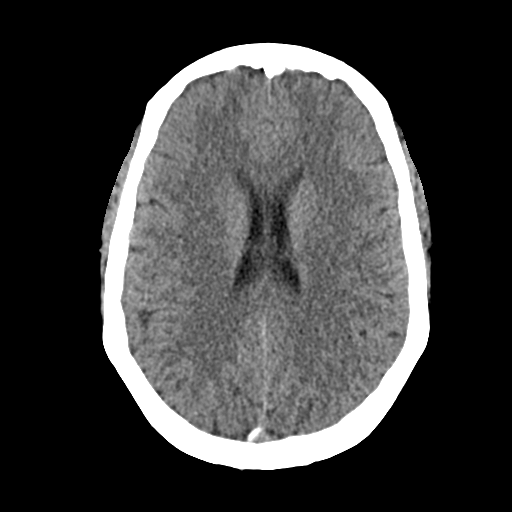

[Series 8: head angio · axial · 0.42mm/px · z∈[-131,-32]mm · 4 of 55 slices shown]
[im 11/55  brain]
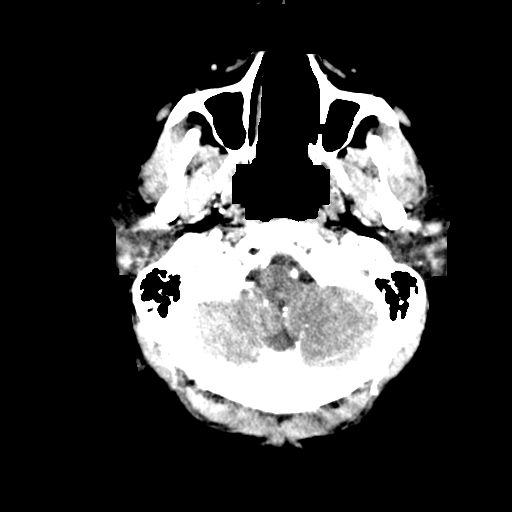
[im 22/55  bone]
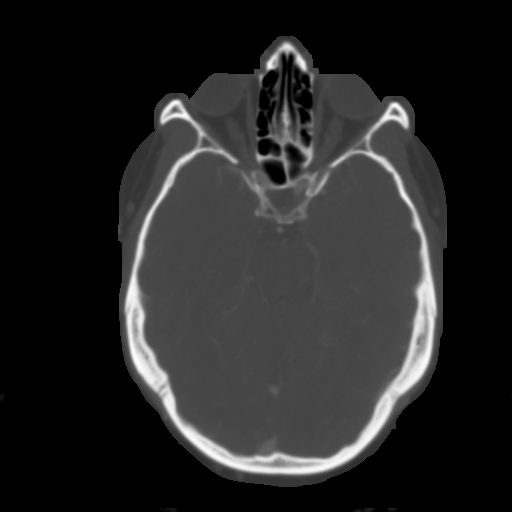
[im 33/55  brain]
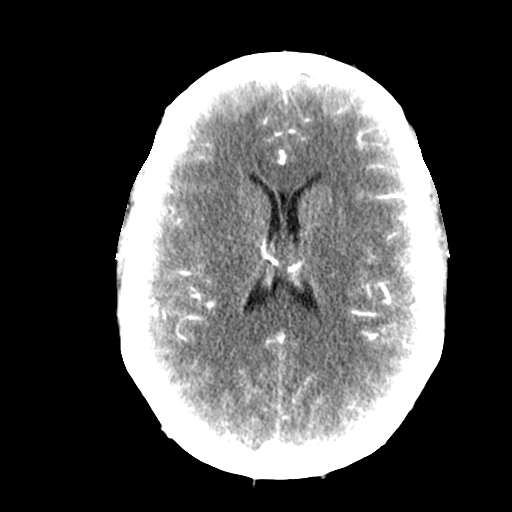
[im 44/55  bone]
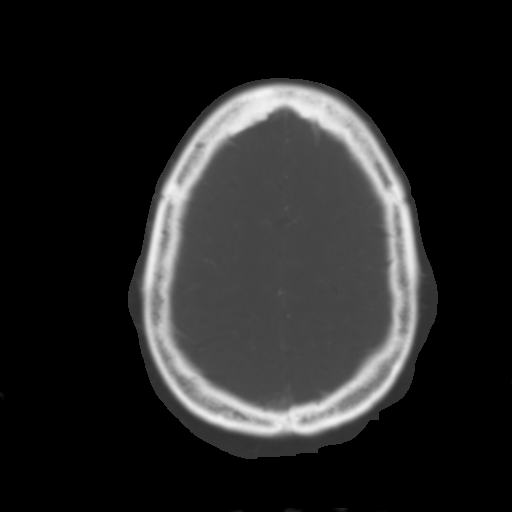

[6 of 30 positions shown; findings below may reference images not displayed]

FINDINGS: CT HEAD

Brain:

No evidence of acute intracranial hemorrhage.

No demarcated cortical infarction.

No evidence of intracranial mass.

No midline shift or extra-axial fluid collection.

Cerebral volume is normal for age.

Vascular: Reported separately

Skull: Normal. Negative for fracture or focal lesion.

Sinuses: Visualized paranasal sinuses are clear. Visualized mastoid
air cells are clear.

Orbits: Visualized orbits demonstrate no acute abnormality.

Other: Again demonstrated are multiple prominent intraparotid lymph
nodes, the largest on the right measuring up to 6 mm in short axis

CTA HEAD

Anterior circulation:

The bilateral intracranial internal carotid arteries are patent
without significant stenosis.

Right middle and anterior cerebral arteries patent without
significant proximal stenosis.

Left middle and anterior cerebral arteries patent without
significant proximal stenosis.

Unchanged 2 mm aneurysm versus infundibulum at the expected origin
of the left posterior communicating artery (series 16, image 65)
(series 13, image 89).

Posterior circulation:

Intracranial vertebral arteries are patent without significant
stenosis.

Basilar artery is patent without high-grade stenosis.

Bilateral posterior cerebral arteries patent without significant
proximal stenosis.

Venous sinuses: Within limitations of contrast timing, no convincing
thrombus.

Anatomic variants: Aplastic A1 right anterior cerebral artery.
IMPRESSION: CT head:

1. Normal noncontrast CT appearance of the brain. No evidence of
acute intracranial abnormality.
2. Again demonstrated are multiple nonspecific prominent
intraparotid lymph nodes, the largest on the right measuring up to 6
mm in short axis. Clinical correlation is suggested.

CTA head:

1. Unchanged 2 mm aneurysm versus infundibulum at the expected
origin of the left posterior communicating artery.
2. No new intracranial aneurysm identified.
3. No intracranial large vessel occlusion or significant proximal
arterial stenosis.

## 2019-02-09 MED ORDER — IOPAMIDOL (ISOVUE-370) INJECTION 76%
75.0000 mL | Freq: Once | INTRAVENOUS | Status: AC | PRN
  Administered 2019-02-09: 75 mL via INTRAVENOUS

## 2019-02-14 ENCOUNTER — Telehealth: Payer: Self-pay

## 2019-02-14 NOTE — Telephone Encounter (Signed)
-----   Message from Pieter Partridge, DO sent at 02/12/2019 10:46 AM EDT ----- CTA.  Tiny aneurysm is stable (unchanged from last year).  Will discuss further at follow up next week.

## 2019-02-14 NOTE — Telephone Encounter (Signed)
Spoke with patient she was informed of results and understands

## 2019-02-20 ENCOUNTER — Ambulatory Visit: Payer: Managed Care, Other (non HMO) | Admitting: Neurology

## 2019-02-27 NOTE — Progress Notes (Signed)
Virtual Visit via Video Note The purpose of this virtual visit is to provide medical care while limiting exposure to the novel coronavirus.    Consent was obtained for video visit:  Yes.   Answered questions that patient had about telehealth interaction:  Yes.   I discussed the limitations, risks, security and privacy concerns of performing an evaluation and management service by telemedicine. I also discussed with the patient that there may be a patient responsible charge related to this service. The patient expressed understanding and agreed to proceed.  Pt location: Home Physician Location: Home Name of referring provider:  Georgann HousekeeperHusain, Karrar, MD I connected with Faith AllegraBarbara Moody at patients initiation/request on 03/01/2019 at 10:10 AM EDT by video enabled telemedicine application and verified that I am speaking with the correct person using two identifiers. [name]:  [id number] [name]:  [birth year] Video Participants:  Faith AllegraBarbara Moody   History of Present Illness:  Faith HedgeBarbara Abdulazizis a 38 year old female with asthma, anxiety and type 2 diabetes who follows up for migraine.  UPDATE: Intensity:  severe Duration:  Sleeps and lasts next morning Frequency:  3 times a week  To follow up on PCOM aneurysm, she had CTA of head on 02/09/2019 which was personally reviewed and again demonstrated 2 mm aneurysm vs infundibulum at origin of left posterior communicating artery, unchanged.  Reyvow caused side effects.  Unable to afford Vanuatubrelvy and insurance wouldn't cover it.  Stopped topiramate at end of August because she started fertility treatment.    Envisaline can't take NSAIDs because will stop swelling in gums   Current NSAIDS:none Does not take pain relievers at work because it causes fatigue. Current analgesics:Tylenol Current triptans:None Current ergotamine:None Other current abortive:  none Current anti-emetic:Zofran ODT 4 mg Current muscle relaxants:None Current  anti-anxiolytic:Klonopin Current sleep aide:None Current Antihypertensive medications:None Current Antidepressant medications:none Current Anticonvulsant medications:none Current anti-CGRP:Aimovig 70 mg Current Vitamins/Herbal/Supplements:None Current Antihistamines/Decongestants:Claritin Other therapy:None Hormone/birth control:None  Caffeine:No Hydration: Yes Exercise:Yes Depression:No; Anxiety:Yes Other pain:No Sleep hygiene:Has OSA and cannot tolerate CPAP. However, she reports sleeping well.  HISTORY: Onset: 38 years old Location: Left parietal region Quality: pounding Initial Intensity: 8.5/10 Aura: no Prodrome: no Postdrome: no Associated symptoms: Nausea, photophobia, phonophobia, blurred vision,transient bilateral vision loss. On one occasion, paresthesias. She does not endorse thunderclap headache or severe headache waking her from sleep. Initial Duration: 4 hours to all day Initial Frequency: 1 to 2 days per week Initial Frequency of abortive medication: 1 to 2 days per week Triggers: fluorescent light  Relieving factors: no Activity: aggravates  Past NSAIDS: ibuprofen, Sprix nasal spray, Cambia (effective but did not like the taste) Past analgesics: Tylenol, Excedrin Past abortive triptans: Maxalt (increased headache), sumatriptan 100mg  Past muscle relaxants: no Past anti-emetic: no Past antihypertensive medications: no Past antidepressant medications: nortriptyline 100mg , Celexa 5mg , bupropion Past anticonvulsant medications: topiramate 100mg  Pa Past vitamins/Herbal/Supplements: no Past antihistamines/decongestants: no Other past therapies: Reyvow (made her limbs numb)  Family history of headache: Mom, grandmother  Due to more frequentassociated symptoms such as transient bilateral vision loss, vertigo and on one occasion acute paresthesias, she had an MRIand MRA of the head on 12/25/2017, which was  personally reviewed and was negative for any secondary etiology of symptoms but 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  Past Medical History: Past Medical History:  Diagnosis Date  . Allergy   . Anxiety   . Asthma   . Depression   . Thyroid disease  Medications: Outpatient Encounter Medications as of 03/01/2019  Medication Sig  . Albuterol Sulfate (PROAIR RESPICLICK IN) Inhale into the lungs.  . budesonide-formoterol (SYMBICORT) 160-4.5 MCG/ACT inhaler Inhale 2 puffs into the lungs 2 (two) times daily.  . clonazePAM (KLONOPIN) 0.5 MG tablet Take 0.5 tablets (0.25 mg total) by mouth 2 (two) times daily as needed for anxiety.  Dorise Hiss (AIMOVIG) 70 MG/ML SOAJ Inject 70 mg into the skin every 30 (thirty) days.  Marylin Crosby Succinate (REYVOW) 100 MG TABS Take 100 mg by mouth as needed (Maximum 1 tablet in 24 hours).  . loratadine (CLARITIN) 10 MG tablet Take 10 mg by mouth daily.  . mometasone (NASONEX) 50 MCG/ACT nasal spray Place 2 sprays into the nose daily.  . ondansetron (ZOFRAN ODT) 4 MG disintegrating tablet Take 1 tablet (4 mg total) by mouth every 8 (eight) hours as needed for nausea or vomiting.  . ranitidine (ZANTAC) 150 MG capsule Take 150 mg by mouth 2 (two) times daily.  Marland Kitchen topiramate (TOPAMAX) 100 MG tablet Take 1 tablet (100 mg total) by mouth at bedtime.  Marland Kitchen Ubrogepant (UBRELVY) 100 MG TABS Take 100 mg by mouth every 2 (two) hours as needed. Max of 200mg  in 24hrs   No facility-administered encounter medications on file as of 03/01/2019.     Allergies: Allergies  Allergen Reactions  . Apple Anaphylaxis  . Fish Allergy Anaphylaxis    All seafood  . Peanuts [Peanut Oil] Anaphylaxis    Tree nuts, All Nuts  . Amoxicillin Hives  . Molds & Smuts Hives  . Percocet [Oxycodone-Acetaminophen] Nausea And Vomiting  . Prednisone Other (See Comments)    Altered mental status    Family History: Family  History  Problem Relation Age of Onset  . Thyroid disease Mother   . Cancer Maternal Grandmother   . Hypertension Maternal Grandmother   . Hypertension Maternal Grandfather   . Cancer Maternal Grandfather   . Hypertension Paternal Grandmother   . Cancer Paternal Grandmother   . Hypertension Paternal Grandfather   . Cancer Paternal Grandfather     Social History: Social History   Socioeconomic History  . Marital status: Married    Spouse name: Not on file  . Number of children: Not on file  . Years of education: Not on file  . Highest education level: Not on file  Occupational History  . Not on file  Social Needs  . Financial resource strain: Not on file  . Food insecurity    Worry: Not on file    Inability: Not on file  . Transportation needs    Medical: Not on file    Non-medical: Not on file  Tobacco Use  . Smoking status: Never Smoker  . Smokeless tobacco: Never Used  Substance and Sexual Activity  . Alcohol use: Not Currently    Alcohol/week: 0.0 standard drinks    Frequency: Never  . Drug use: Not Currently  . Sexual activity: Not on file  Lifestyle  . Physical activity    Days per week: Not on file    Minutes per session: Not on file  . Stress: Not on file  Relationships  . Social 03/03/2019 on phone: Not on file    Gets together: Not on file    Attends religious service: Not on file    Active member of club or organization: Not on file    Attends meetings of clubs or organizations: Not on file    Relationship status: Not  on file  . Intimate partner violence    Fear of current or ex partner: Not on file    Emotionally abused: Not on file    Physically abused: Not on file    Forced sexual activity: Not on file  Other Topics Concern  . Not on file  Social History Narrative  . Not on file    Observations/Objective:   Height 5\' 4"  (1.626 m), weight 227 lb (103 kg). No acute distress.  Alert and oriented.  Speech fluent and not dysarthric.   Language intact.  Eyes orthophoric on primary gaze.  Face symmetric.  Assessment and Plan:   1.  Migraine without aura, without status migrainosus, not intractable 2.  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  1.  For preventative management, we will stop Aimovig and start Botox 2.  For abortive therapy, Tylenol 3.  Limit use of pain relievers to no more than 2 days out of week to prevent risk of rebound or medication-overuse headache. 4.  Keep headache diary 5.  Exercise, hydration, caffeine cessation, sleep hygiene, monitor for and avoid triggers 6.  Consider:  magnesium citrate 400mg  daily, riboflavin 400mg  daily, and coenzyme Q10 100mg  three times daily 7. Repeat MRA of head in one year. 8. Follow up Botox   Follow Up Instructions:    -I discussed the assessment and treatment plan with the patient. The patient was provided an opportunity to ask questions and all were answered. The patient agreed with the plan and demonstrated an understanding of the instructions.   The patient was advised to call back or seek an in-person evaluation if the symptoms worsen or if the condition fails to improve as anticipated.    Dudley Major, DO

## 2019-03-01 ENCOUNTER — Encounter: Payer: Self-pay | Admitting: Neurology

## 2019-03-01 ENCOUNTER — Telehealth (INDEPENDENT_AMBULATORY_CARE_PROVIDER_SITE_OTHER): Payer: Managed Care, Other (non HMO) | Admitting: Neurology

## 2019-03-01 ENCOUNTER — Other Ambulatory Visit: Payer: Self-pay

## 2019-03-01 VITALS — Ht 64.0 in | Wt 227.0 lb

## 2019-03-01 DIAGNOSIS — I671 Cerebral aneurysm, nonruptured: Secondary | ICD-10-CM

## 2019-03-01 DIAGNOSIS — G43009 Migraine without aura, not intractable, without status migrainosus: Secondary | ICD-10-CM

## 2019-03-01 NOTE — Patient Instructions (Signed)
1.  We will stop Aimovig and start Botox 2.  Limit use of pain relievers (Tylenol) to no more than 2 days out of week to prevent risk of rebound or medication-overuse headache. 3.  Keep headache diary 4.  Exercise, hydration, caffeine cessation, sleep hygiene, monitor for and avoid triggers 5.  Consider:  magnesium citrate 400mg  daily, riboflavin 400mg  daily, and coenzyme Q10 100mg  three times daily 6. Always keep in mind that currently taking a hormone or birth control may be a possible trigger or aggravating factor for migraine. 7. Repeat MRA of head in one year 8. Follow up for botox

## 2019-04-16 NOTE — Progress Notes (Signed)
Submitted PA to [facility] on 101/27/20 Called last week to follow up on status of PA Received fax from [employer] that NO PA is required for patient Botox but may be subject to review for coverage after the full claim is submitted. coverage also depends upton the members eligibility and benefits at the date of services.    Pt can use buy& bill or specialty pharmacy [facility]/ [facility]

## 2019-04-20 ENCOUNTER — Ambulatory Visit: Payer: Managed Care, Other (non HMO) | Admitting: Neurology

## 2019-12-23 ENCOUNTER — Other Ambulatory Visit: Payer: Self-pay

## 2019-12-23 ENCOUNTER — Emergency Department (HOSPITAL_COMMUNITY)
Admission: EM | Admit: 2019-12-23 | Discharge: 2019-12-23 | Discharge status: Against Medical Advice | Payer: Self-pay | Attending: Emergency Medicine | Admitting: Emergency Medicine

## 2019-12-23 ENCOUNTER — Encounter (HOSPITAL_COMMUNITY): Payer: Self-pay | Admitting: Emergency Medicine

## 2019-12-23 DIAGNOSIS — Y999 Unspecified external cause status: Secondary | ICD-10-CM | POA: Diagnosis not present

## 2019-12-23 DIAGNOSIS — S301XXA Contusion of abdominal wall, initial encounter: Secondary | ICD-10-CM | POA: Diagnosis not present

## 2019-12-23 DIAGNOSIS — S20219A Contusion of unspecified front wall of thorax, initial encounter: Secondary | ICD-10-CM | POA: Insufficient documentation

## 2019-12-23 DIAGNOSIS — Z5321 Procedure and treatment not carried out due to patient leaving prior to being seen by health care provider: Secondary | ICD-10-CM | POA: Insufficient documentation

## 2019-12-23 DIAGNOSIS — Y939 Activity, unspecified: Secondary | ICD-10-CM | POA: Insufficient documentation

## 2019-12-23 DIAGNOSIS — Y929 Unspecified place or not applicable: Secondary | ICD-10-CM | POA: Insufficient documentation
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
## 2019-12-23 NOTE — ED Notes (Signed)
Patient stated that she was leaving, and would see someone tomorrow.

## 2020-07-02 ENCOUNTER — Other Ambulatory Visit: Payer: Self-pay | Admitting: Obstetrics & Gynecology

## 2020-07-02 DIAGNOSIS — N6320 Unspecified lump in the left breast, unspecified quadrant: Secondary | ICD-10-CM

## 2020-08-29 ENCOUNTER — Ambulatory Visit
Admission: RE | Admit: 2020-08-29 | Discharge: 2020-08-29 | Disposition: A | Discharge status: Home / Self Care | Payer: BC Managed Care – PPO | Source: Ambulatory Visit | Attending: Obstetrics & Gynecology | Admitting: Obstetrics & Gynecology

## 2020-08-29 ENCOUNTER — Other Ambulatory Visit: Payer: Self-pay

## 2020-08-29 DIAGNOSIS — N6320 Unspecified lump in the left breast, unspecified quadrant: Secondary | ICD-10-CM

## 2021-09-23 ENCOUNTER — Other Ambulatory Visit: Payer: Self-pay | Admitting: Obstetrics and Gynecology

## 2021-09-23 DIAGNOSIS — N63 Unspecified lump in unspecified breast: Secondary | ICD-10-CM

## 2021-10-07 ENCOUNTER — Ambulatory Visit
Admission: RE | Admit: 2021-10-07 | Discharge: 2021-10-07 | Disposition: A | Discharge status: Home / Self Care | Payer: BC Managed Care – PPO | Source: Ambulatory Visit | Attending: Obstetrics and Gynecology | Admitting: Obstetrics and Gynecology

## 2021-10-07 DIAGNOSIS — N63 Unspecified lump in unspecified breast: Secondary | ICD-10-CM

## 2022-11-03 ENCOUNTER — Other Ambulatory Visit: Payer: Self-pay | Admitting: Orthopedic Surgery

## 2022-11-03 DIAGNOSIS — R5381 Other malaise: Secondary | ICD-10-CM

## 2022-11-03 DIAGNOSIS — M542 Cervicalgia: Secondary | ICD-10-CM

## 2022-11-28 ENCOUNTER — Ambulatory Visit
Admission: RE | Admit: 2022-11-28 | Discharge: 2022-11-28 | Disposition: A | Discharge status: Home / Self Care | Payer: BC Managed Care – PPO | Source: Ambulatory Visit | Attending: Orthopedic Surgery | Admitting: Orthopedic Surgery

## 2022-11-28 DIAGNOSIS — M542 Cervicalgia: Secondary | ICD-10-CM

## 2023-10-06 ENCOUNTER — Encounter: Payer: Self-pay | Admitting: Allergy & Immunology

## 2023-10-06 ENCOUNTER — Ambulatory Visit (INDEPENDENT_AMBULATORY_CARE_PROVIDER_SITE_OTHER): Payer: Self-pay | Admitting: Allergy & Immunology

## 2023-10-06 ENCOUNTER — Other Ambulatory Visit: Payer: Self-pay

## 2023-10-06 VITALS — BP 108/80 | HR 79 | Temp 98.0°F | Resp 20 | Ht 63.0 in | Wt 228.1 lb

## 2023-10-06 DIAGNOSIS — H905 Unspecified sensorineural hearing loss: Secondary | ICD-10-CM

## 2023-10-06 DIAGNOSIS — L2082 Flexural eczema: Secondary | ICD-10-CM | POA: Diagnosis not present

## 2023-10-06 DIAGNOSIS — T7803XA Anaphylactic reaction due to other fish, initial encounter: Secondary | ICD-10-CM

## 2023-10-06 DIAGNOSIS — J4540 Moderate persistent asthma, uncomplicated: Secondary | ICD-10-CM

## 2023-10-06 DIAGNOSIS — T7803XD Anaphylactic reaction due to other fish, subsequent encounter: Secondary | ICD-10-CM

## 2023-10-06 DIAGNOSIS — J310 Chronic rhinitis: Secondary | ICD-10-CM | POA: Diagnosis not present

## 2023-10-06 MED ORDER — MONTELUKAST SODIUM 10 MG PO TABS: 10.0000 mg | ORAL_TABLET | Freq: Every day | ORAL | 1 refills | Status: AC

## 2023-10-06 MED ORDER — AIRSUPRA 90-80 MCG/ACT IN AERO: 2.0000 | INHALATION_SPRAY | RESPIRATORY_TRACT | 2 refills | Status: AC | PRN

## 2023-10-06 MED ORDER — NEFFY 2 MG/0.1ML NA SOLN
1.0000 | NASAL | 1 refills | Status: DC | PRN
Start: 2023-10-06 — End: 2023-12-15

## 2023-10-06 MED ORDER — BREZTRI AEROSPHERE 160-9-4.8 MCG/ACT IN AERO
1.0000 | INHALATION_SPRAY | Freq: Two times a day (BID) | RESPIRATORY_TRACT | 5 refills | Status: AC
Start: 2023-10-06 — End: ?

## 2023-10-06 NOTE — Patient Instructions (Addendum)
 1. Moderate persistent asthma, uncomplicated - Lung testing looked excellent today. - Because your are using your albuterol so frequently, we are going to restart the Symbicort to see if that helps. - Daily controller medication(s): Singulair 10mg  daily and Breztri 160/9/4.43mcg two puffs twice daily - Prior to physical activity: AirSupra 2 puffs 10-15 minutes before physical activity. - Rescue medications: AirSupra 2 puffs every 4-6 hours as needed - Asthma control goals:  * Full participation in all desired activities (may need albuterol before activity) * Albuterol use two time or less a week on average (not counting use with activity) * Cough interfering with sleep two time or less a month * Oral steroids no more than once a year * No hospitalizations  2. Chronic rhinitis - Because of insurance stipulations, we cannot do skin testing on the same day as your first visit. - We are all working to fight this, but for now we need to do two separate visits.  - We will know more after we do testing at the next visit.  - The skin testing visit can be squeezed in at your convenience.  - Then we can make a more full plan to address all of your symptoms. - Be sure to stop your antihistamines for 3 days before this appointment.  - We are definitely going to restart the Singulair (montelukast) to help with your allergies.  - ENT referral placed for left sided hearing loss.   3. Seafood allergy - We will test for seafood allergies at the next visit as well as almonds and eggs and apples. - Emergency Action Plan provided. - Neffy sent in.  - Call us  if this is too expensive.   4. Eczema  - Continue with the Dermatology recommended ointments.  5. Stinging insect anaphylaxis - We will get blood work at the next visit. - Emergency Action Plan provided. - EpiPen renewed.  6. Return in about 1 week (around 10/13/2023) for ALLERGY TESTING (1-55 + SEAFOOD + ALMOND). You can have the follow up  appointment with Dr. Idolina Maker or a Nurse Practicioner (our Nurse Practitioners are excellent and always have Physician oversight!).    Please inform us  of any Emergency Department visits, hospitalizations, or changes in symptoms. Call us  before going to the ED for breathing or allergy symptoms since we might be able to fit you in for a sick visit. Feel free to contact us  anytime with any questions, problems, or concerns.  It was a pleasure to meet you today!  Websites that have reliable patient information: 1. American Academy of Asthma, Allergy, and Immunology: www.aaaai.org 2. Food Allergy Research and Education (FARE): foodallergy.org 3. Mothers of Asthmatics: http://www.asthmacommunitynetwork.org 4. American College of Allergy, Asthma, and Immunology: www.acaai.org      "Like" us  on Facebook and Instagram for our latest updates!      A healthy democracy works best when [employer] participate! Make sure you are registered to vote! If you have moved or changed any of your contact information, you will need to get this updated before voting! Scan the QR codes below to learn more!

## 2023-10-06 NOTE — Progress Notes (Signed)
 NEW PATIENT  Date of Service/Encounter:  10/06/23  Consult requested by: [facility]   Assessment:   Moderate persistent asthma, uncomplicated  Chronic rhinitis - planning for skin testing at the next visit  Sensorineural hearing loss (SNHL) of left ear  Flexural eczema  Seafood allergy  Plan/Recommendations:   1. Moderate persistent asthma, uncomplicated - Lung testing looked excellent today. - Because your are using your albuterol so frequently, we are going to restart the Symbicort to see if that helps. - Daily controller medication(s): Singulair 10mg  daily and Breztri 160/9/4.31mcg two puffs twice daily - Prior to physical activity: AirSupra 2 puffs 10-15 minutes before physical activity. - Rescue medications: AirSupra 2 puffs every 4-6 hours as needed - Asthma control goals:  * Full participation in all desired activities (may need albuterol before activity) * Albuterol use two time or less a week on average (not counting use with activity) * Cough interfering with sleep two time or less a month * Oral steroids no more than once a year * No hospitalizations  2. Chronic rhinitis - Because of insurance stipulations, we cannot do skin testing on the same day as your first visit. - We are all working to fight this, but for now we need to do two separate visits.  - We will know more after we do testing at the next visit.  - The skin testing visit can be squeezed in at your convenience.  - Then we can make a more full plan to address all of your symptoms. - Be sure to stop your antihistamines for 3 days before this appointment.  - We are definitely going to restart the Singulair (montelukast) to help with your allergies.  - ENT referral placed for left sided hearing loss.   3. Seafood allergy - We will test for seafood allergies at the next visit as well as almonds and eggs and apples. - Emergency Action Plan provided. - Neffy sent in.  - Call us   if this is too expensive.   4. Eczema  - Continue with the Dermatology recommended ointments.  5. Stinging insect anaphylaxis - We will get blood work at the next visit. - Emergency Action Plan provided. - EpiPen renewed.  6. Return in about 1 week (around 10/13/2023) for ALLERGY TESTING (1-55 + SEAFOOD + ALMOND). You can have the follow up appointment with Dr. Idolina Maker or a Nurse Practicioner (our Nurse Practitioners are excellent and always have Physician oversight!).    This note in its entirety was forwarded to the Provider who requested this consultation.  Subjective:   Faith Moody is a 43 y.o. female presenting today for evaluation of  Chief Complaint  Patient presents with   Establish Care    Allergies states the Claritin is no long affective Asthma has had flares in past 2 months   Wheezing   Cough    Faith Moody has a history of the following: Patient Active Problem List   Diagnosis Date Noted   Polycystic ovary syndrome 04/26/2018   Rectal bleeding 01/10/2018   Premenstrual dysphoric disorder 09/04/2014   Non-toxic multinodular goiter 12/29/2012   Vitamin D deficiency 12/14/2012   Chronic mixed headache syndrome 05/14/2011   Fatigue 10/02/2010   IBS (irritable bowel syndrome) 09/10/2010   Metabolic syndrome 09/10/2010   Obstructive sleep apnea 09/10/2010   Allergic rhinitis 06/24/2010   Bronchial asthma 06/24/2010   GERD (gastroesophageal reflux disease) 06/24/2010   Obesity 06/24/2010   Recurrent sinus infections 06/24/2010  History obtained from: chart review and patient.  Discussed the use of AI scribe software for clinical note transcription with the patient and/or guardian, who gave verbal consent to proceed.  Faith Moody was referred by [facility]     Faith Moody is a 43 y.o. female presenting for an evaluation of multiple atopic complaints.  Asthma/Respiratory Symptom History: She has a history of asthma  diagnosed approximately ten years ago after moving from [place]  to [place] , where she experienced increased symptoms due to environmental factors such as pollen and humidity. She uses Singulair, which she finds effective, and an emergency inhaler daily due to exercise-induced asthma. She previously used Symbicort but discontinued it due to side effects like tremors, which improved when she reduced the dose. She has had two steroid injections in the past year due to uncontrolled asthma, including an asthma attack during sleep.  Allergic Rhinitis Symptom History: She describes extensive environmental allergies, including pollen, dust, and molds, with symptoms improving only in winter. She has tried various antihistamines like Zyrtec and Claritin without relief. She has a history of allergy testing and previously received allergy shots, which were ineffective and costly. She has not had allergy testing in several years.  She experiences chronic ear drainage and hearing loss in her left ear. Her mother also had similar issues. She has not yet seen an ENT specialist for this issue.  Food Allergy Symptom History: She has multiple food allergies, including seafood, almonds, and eggs, which cause digestive issues. She also experiences oral allergy syndrome with apples, leading to severe reactions such as bleeding gums and anaphylaxis. She does not currently have an EpiPen despite these severe reactions.  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   She reports a history of stinging insect allergies, including bees and yellow jackets, which cause significant swelling and itching. She has been tested and confirmed allergic to these insects.  Otherwise, there is no history of other atopic diseases, including drug allergies, stinging insect allergies, or contact dermatitis.  There is no significant infectious history. Vaccinations are up to date.    Past Medical History: Patient Active Problem List   Diagnosis Date Noted   Polycystic ovary syndrome 04/26/2018   Rectal bleeding 01/10/2018   Premenstrual dysphoric disorder 09/04/2014   Non-toxic multinodular goiter 12/29/2012   Vitamin D deficiency 12/14/2012   Chronic mixed headache syndrome 05/14/2011   Fatigue 10/02/2010   IBS (irritable bowel syndrome) 09/10/2010   Metabolic syndrome 09/10/2010   Obstructive sleep apnea 09/10/2010   Allergic rhinitis 06/24/2010   Bronchial asthma 06/24/2010   GERD (gastroesophageal reflux disease) 06/24/2010   Obesity 06/24/2010   Recurrent sinus infections 06/24/2010    Medication List:  Allergies as of 10/06/2023       Reactions   Apple Juice Anaphylaxis   Fish Allergy Anaphylaxis   All seafood   Peanuts [peanut Oil] Anaphylaxis   Tree nuts, All Nuts   Amoxicillin Hives   Molds & Smuts Hives   Percocet [oxycodone-acetaminophen ] Nausea And Vomiting   Prednisone Other (See Comments)   Altered mental status        Medication List        Accurate as of Oct 06, 2023  9:09 PM. If you have any questions, ask your nurse or doctor.          STOP taking these medications  budesonide-formoterol 160-4.5 MCG/ACT inhaler Commonly known as: SYMBICORT Stopped by: Sameera Betton Louis Savas Elvin       TAKE these medications    Airsupra 90-80 MCG/ACT Aero Generic drug: Albuterol-Budesonide Inhale 2 puffs into the lungs every 4 (four) hours as needed. Started by: Rochester Chuck   amitriptyline 10 MG tablet Commonly known as: ELAVIL Take 10 mg by mouth at bedtime.   baclofen 10 MG tablet Commonly known as: LIORESAL Take 10 mg by mouth.   Breztri Aerosphere 160-9-4.8 MCG/ACT Aero inhaler Generic drug: budesonide-glycopyrrolate-formoterol Inhale 1-2 puffs into the lungs in the morning and at bedtime. Started by: Rochester Chuck   busPIRone 5  MG tablet Commonly known as: BUSPAR Take by mouth.   clobetasol 0.05 % external solution Commonly known as: TEMOVATE   clonazePAM  0.5 MG tablet Commonly known as: KLONOPIN  Take 0.5 tablets (0.25 mg total) by mouth 2 (two) times daily as needed for anxiety.   cloNIDine 0.2 MG tablet Commonly known as: CATAPRES Take by mouth.   cyclobenzaprine 5 MG tablet Commonly known as: FLEXERIL   doxycycline 100 MG capsule Commonly known as: VIBRAMYCIN Take 100 mg by mouth daily.   DULoxetine 30 MG capsule Commonly known as: CYMBALTA Take 30 mg by mouth.   Erenumab -aooe 70 MG/ML Soaj Commonly known as: Aimovig  Inject 70 mg into the skin every 30 (thirty) days.   fluocinonide 0.05 % external solution Commonly known as: LIDEX   loratadine 10 MG tablet Commonly known as: CLARITIN Take 10 mg by mouth daily.   mometasone 50 MCG/ACT nasal spray Commonly known as: NASONEX Place 2 sprays into the nose daily.   montelukast 10 MG tablet Commonly known as: Singulair Take 1 tablet (10 mg total) by mouth at bedtime. Started by: Constance Hackenberg Louis Carman Auxier   Neffy 2 MG/0.1ML Soln Generic drug: EPINEPHrine Place 1 spray into the nose as needed. Started by: Rochester Chuck   ondansetron  4 MG disintegrating tablet Commonly known as: Zofran  ODT Take 1 tablet (4 mg total) by mouth every 8 (eight) hours as needed for nausea or vomiting.   PROAIR RESPICLICK IN Inhale into the lungs.   sertraline 100 MG tablet Commonly known as: ZOLOFT Take 100 mg by mouth daily.        Birth History: non-contributory  Developmental History: non-contributory  Past Surgical History: History reviewed. No pertinent surgical history.   Family History: Family History  Problem Relation Age of Onset   Allergic rhinitis Mother    Thyroid disease Mother    Cancer Maternal Grandmother    Hypertension Maternal Grandmother    Hypertension Maternal Grandfather    Cancer Maternal Grandfather     Hypertension Paternal Grandmother    Cancer Paternal Grandmother    Hypertension Paternal Grandfather    Cancer Paternal Grandfather      Social History: Clarice lives at home with her family.  They live in a house that is 42 years old.  There is carpeting vinyl in the main living areas and carpeting in the bedroom.  They have gas heating and central cooling.  There are 2 dogs inside of the home.  There are dust mite covers on the bed and the pillows.  There is no tobacco exposure.  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  She has exposed to fumes, chemicals, and dust.  They do live near an active industrial area.   Review of systems otherwise negative other than that mentioned in the HPI.    Objective:  Blood pressure 108/80, pulse 79, temperature 98 F (36.7 C), resp. rate 20, height 5\' 3"  (1.6 m), weight 228 lb 1.6 oz (103.5 kg), SpO2 99%. Body mass index is 40.41 kg/m.     Physical Exam Vitals reviewed.  Constitutional:      Appearance: She is well-developed.  HENT:     Head: Normocephalic and atraumatic.     Right Ear: Tympanic membrane, ear canal and external ear normal. No drainage, swelling or tenderness. Tympanic membrane is not injected, scarred, erythematous, retracted or bulging.     Left Ear: Tympanic membrane, ear canal and external ear normal. No drainage, swelling or tenderness. Tympanic membrane is not injected, scarred, erythematous, retracted or bulging.     Nose: No nasal deformity, septal deviation, mucosal edema or rhinorrhea.     Right Turbinates: Enlarged, swollen and pale.     Left Turbinates: Enlarged, swollen and pale.     Right Sinus: No maxillary sinus tenderness or frontal sinus tenderness.     Left Sinus: No maxillary sinus tenderness or frontal sinus tenderness.     Mouth/Throat:     Lips: Pink.     Mouth: Mucous membranes are moist. Mucous membranes are not pale and not dry.     Pharynx: Uvula midline.     Comments:  Cobblestoning in the posterior oropharynx.  Eyes:     General:        Right eye: No discharge.        Left eye: No discharge.     Conjunctiva/sclera: Conjunctivae normal.     Right eye: Right conjunctiva is not injected. No chemosis.    Left eye: Left conjunctiva is not injected. No chemosis.    Pupils: Pupils are equal, round, and reactive to light.  Cardiovascular:     Rate and Rhythm: Normal rate and regular rhythm.     Heart sounds: Normal heart sounds.  Pulmonary:     Effort: Pulmonary effort is normal. No tachypnea, accessory muscle usage or respiratory distress.     Breath sounds: Normal breath sounds. No wheezing, rhonchi or rales.     Comments: No crackles or wheezes noted.  Chest:     Chest wall: No tenderness.  Abdominal:     Tenderness: There is no abdominal tenderness. There is no guarding or rebound.  Lymphadenopathy:     Head:     Right side of head: No submandibular, tonsillar or occipital adenopathy.     Left side of head: No submandibular, tonsillar or occipital adenopathy.     Cervical: No cervical adenopathy.  Skin:    Coloration: Skin is not pale.     Findings: No abrasion, erythema, petechiae or rash. Rash is not papular, urticarial or vesicular.  Neurological:     Mental Status: She is alert.  Psychiatric:        Behavior: Behavior is cooperative.      Diagnostic studies:    Spirometry: results normal (FEV1: 2.26/92%, FVC: 2.67/89%, FEV1/FVC: 85%).    Spirometry consistent with normal pattern.    Allergy Studies: none           Drexel Gentles, MD [facility] 

## 2023-10-20 ENCOUNTER — Ambulatory Visit: Admitting: Allergy & Immunology

## 2023-10-20 DIAGNOSIS — T63481D Toxic effect of venom of other arthropod, accidental (unintentional), subsequent encounter: Secondary | ICD-10-CM

## 2023-10-20 DIAGNOSIS — T7803XD Anaphylactic reaction due to other fish, subsequent encounter: Secondary | ICD-10-CM | POA: Diagnosis not present

## 2023-10-20 DIAGNOSIS — T7803XA Anaphylactic reaction due to other fish, initial encounter: Secondary | ICD-10-CM

## 2023-10-20 DIAGNOSIS — J4540 Moderate persistent asthma, uncomplicated: Secondary | ICD-10-CM

## 2023-10-20 DIAGNOSIS — L2082 Flexural eczema: Secondary | ICD-10-CM

## 2023-10-20 DIAGNOSIS — J302 Other seasonal allergic rhinitis: Secondary | ICD-10-CM | POA: Diagnosis not present

## 2023-10-20 DIAGNOSIS — J3089 Other allergic rhinitis: Secondary | ICD-10-CM

## 2023-10-20 NOTE — Patient Instructions (Addendum)
 1. Moderate persistent asthma, uncomplicated - Lung testing looked excellent at the last visit.  - Daily controller medication(s): Singulair  10mg  daily and Breztri  160/9/4.48mcg two puffs twice daily - Prior to physical activity: AirSupra  2 puffs 10-15 minutes before physical activity. - Rescue medications: AirSupra  2 puffs every 4-6 hours as needed - Asthma control goals:  * Full participation in all desired activities (may need albuterol before activity) * Albuterol use two time or less a week on average (not counting use with activity) * Cough interfering with sleep two time or less a month * Oral steroids no more than once a year * No hospitalizations  2. Chronic rhinitis - Testing today showed: grasses, ragweed, weeds, trees, dust mites, cat, and dog - Copy of test results provided.  - Avoidance measures provided. - Stop taking: Claritin (loratadine) - Continue with: Singulair  (montelukast ) 10mg  daily - Start taking: Xyzal (levocetirizine) 5mg  tablet once daily and Ryaltris (olopatadine/mometasone) two sprays per nostril 1-2 times daily as needed - You can use an extra dose of the antihistamine, if needed, for breakthrough symptoms.  - Consider nasal saline rinses 1-2 times daily to remove allergens from the nasal cavities as well as help with mucous clearance (this is especially helpful to do before the nasal sprays are given) - Consider allergy shots as a means of long-term control. - Allergy shots "re-train" and "reset" the immune system to ignore environmental allergens and decrease the resulting immune response to those allergens (sneezing, itchy watery eyes, runny nose, nasal congestion, etc).    - Allergy shots improve symptoms in 75-85% of patients.  - We can discuss more at the next appointment if the medications are not working for you.  3. Seafood allergy - Testing was negative to the seafood and other foods tested. - Copy of testing results provided.  - We are confirming  the seafood with blood work.  - Emergency Action Plan provided. - Neffy sent in.   4. Eczema  - Continue with the Dermatology recommended ointments.  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  - Emergency Action Plan provided.  6. Return in about 3 months (around 01/20/2024). You can have the follow up appointment with Dr. Idolina Maker or a Nurse Practicioner (our Nurse Practitioners are excellent and always have Physician oversight!).    Please inform us  of any Emergency Department visits, hospitalizations, or changes in symptoms. Call us  before going to the ED for breathing or allergy symptoms since we might be able to fit you in for a sick visit. Feel free to contact us  anytime with any questions, problems, or concerns.  It was a pleasure to meet you today!  Websites that have reliable patient information: 1. American Academy of Asthma, Allergy, and Immunology: www.aaaai.org 2. Food Allergy Research and Education (FARE): foodallergy.org 3. Mothers of Asthmatics: http://www.asthmacommunitynetwork.org 4. American College of Allergy, Asthma, and Immunology: www.acaai.org      "Like" us  on Facebook and Instagram for our latest updates!      A healthy democracy works best when [employer] participate! Make sure you are registered to vote! If you have moved or changed any of your contact information, you will need to get this updated before voting! Scan the QR codes below to learn more!        Airborne Adult Perc - 10/20/23 1457     Time Antigen Placed 1458    Allergen Manufacturer Floyd Hutchinson    Location Back    Number of Test 55  1. Control-Buffer 50% Glycerol Negative    2. Control-Histamine 2+    3. Bahia Negative    4. [place] 3+    5. Johnson 3+    6. [place]  Blue 3+    7. Meadow Fescue 3+    8. Perennial Rye 3+    9. Timothy 3+    10. Ragweed Mix Negative    11. Cocklebur Negative    12. Plantain,  English Negative    13. Baccharis Negative     14. Dog Fennel 2+    15. Russian Thistle Negative    16. Lamb's Quarters Negative    17. Sheep Sorrell 3+    18. Rough Pigweed 3+    19. Marsh Elder, Rough 3+    20. Mugwort, Common Negative    21. Box, Elder 2+    22. Cedar, red Negative    23. Sweet Gum 3+    24. Pecan Pollen 2+    25. Pine Mix 2+    26. Walnut, Black Pollen 2+    27. Red Mulberry 2+    28. Ash Mix 2+    29. Birch Mix 4+    30. Beech American 2+    31. Cottonwood, [place] 2+    32. Hickory, White 4+    33. Maple Mix 2+    34. Oak, [place] Mix 4+    35. Sycamore Eastern 2+    36. Alternaria Alternata Negative    37. Cladosporium Herbarum Negative    38. Aspergillus Mix Negative    39. Penicillium Mix Negative    40. Bipolaris Sorokiniana (Helminthosporium) Negative    41. Drechslera Spicifera (Curvularia) Negative    42. Mucor Plumbeus Negative    43. Fusarium Moniliforme Negative    44. Aureobasidium Pullulans (pullulara) Negative    45. Rhizopus Oryzae Negative    46. Botrytis Cinera Negative    47. Epicoccum Nigrum Negative    48. Phoma Betae Negative    49. Dust Mite Mix Negative    50. Cat Hair 10,000 BAU/ml Negative    51.  Dog Epithelia Negative    52. Mixed Feathers Negative    53. Horse Epithelia Negative    54. Cockroach, German Negative    55. Tobacco Leaf Negative             Intradermal - 10/20/23 1600     Control Negative    Bahia 4+    Ragweed Mix 4+    Mold 1 Negative    Mold 2 Negative    Mold 3 Negative    Mold 4 Negative    Mite Mix 3+    Cat 3+    Dog 3+    Cockroach Negative             Food Adult Perc - 10/20/23 1500     Time Antigen Placed 1457    Allergen Manufacturer Floyd Hutchinson    Location Back    Number of allergen test 15    7. Egg White, Chicken Negative    8. Shellfish Mix Negative    9. Fish Mix Negative    12. Almond Negative    18. Trout Negative    19. Tuna Negative    20. Salmon Negative    21. Flounder Negative    22. Codfish Negative     23. Shrimp Negative    24. Crab Negative    25. Lobster Negative    26. Oyster Negative    27. Scallops Negative  58. Apple Negative             Reducing Pollen Exposure  The American Academy of Allergy, Asthma and Immunology suggests the following steps to reduce your exposure to pollen during allergy seasons.    Do not hang sheets or clothing out to dry; pollen may collect on these items. Do not mow lawns or spend time around freshly cut grass; mowing stirs up pollen. Keep windows closed at night.  Keep car windows closed while driving. Minimize morning activities outdoors, a time when pollen counts are usually at their highest. Stay indoors as much as possible when pollen counts or humidity is high and on windy days when pollen tends to remain in the air longer. Use air conditioning when possible.  Many air conditioners have filters that trap the pollen spores. Use a HEPA room air filter to remove pollen form the indoor air you breathe.  Control of Dog or Cat Allergen  Avoidance is the best way to manage a dog or cat allergy. If you have a dog or cat and are allergic to dog or cats, consider removing the dog or cat from the home. If you have a dog or cat but don't want to find it a new home, or if your family wants a pet even though someone in the household is allergic, here are some strategies that may help keep symptoms at bay:  Keep the pet out of your bedroom and restrict it to only a few rooms. Be advised that keeping the dog or cat in only one room will not limit the allergens to that room. Don't pet, hug or kiss the dog or cat; if you do, wash your hands with soap and water. High-efficiency particulate air (HEPA) cleaners run continuously in a bedroom or living room can reduce allergen levels over time. Regular use of a high-efficiency vacuum cleaner or a central vacuum can reduce allergen levels. Giving your dog or cat a bath at least once a week can reduce airborne  allergen.  Control of Dust Mite Allergen    Dust mites play a major role in allergic asthma and rhinitis.  They occur in environments with high humidity wherever human skin is found.  Dust mites absorb humidity from the atmosphere (ie, they do not drink) and feed on organic matter (including shed human and animal skin).  Dust mites are a microscopic type of insect that you cannot see with the naked eye.  High levels of dust mites have been detected from mattresses, pillows, carpets, upholstered furniture, bed covers, clothes, soft toys and any woven material.  The principal allergen of the dust mite is found in its feces.  A gram of dust may contain 1,000 mites and 250,000 fecal particles.  Mite antigen is easily measured in the air during house cleaning activities.  Dust mites do not bite and do not cause harm to humans, other than by triggering allergies/asthma.    Ways to decrease your exposure to dust mites in your home:  Encase mattresses, box springs and pillows with a mite-impermeable barrier or cover   Wash sheets, blankets and drapes weekly in hot water (130 F) with detergent and dry them in a dryer on the hot setting.  Have the room cleaned frequently with a vacuum cleaner and a damp dust-mop.  For carpeting or rugs, vacuuming with a vacuum cleaner equipped with a high-efficiency particulate air (HEPA) filter.  The dust mite allergic individual should not be in a room which is  being cleaned and should wait 1 hour after cleaning before going into the room. Do not sleep on upholstered furniture (eg, couches).   If possible removing carpeting, upholstered furniture and drapery from the home is ideal.  Horizontal blinds should be eliminated in the rooms where the person spends the most time (bedroom, study, television room).  Washable vinyl, roller-type shades are optimal. Remove all non-washable stuffed toys from the bedroom.  Wash stuffed toys weekly like sheets and blankets above.   Reduce  indoor humidity to less than 50%.  Inexpensive humidity monitors can be purchased at most hardware stores.  Do not use a humidifier as can make the problem worse and are not recommended.  Allergy Shots  Allergies are the result of a chain reaction that starts in the immune system. Your immune system controls how your body defends itself. For instance, if you have an allergy to pollen, your immune system identifies pollen as an invader or allergen. Your immune system overreacts by producing antibodies called Immunoglobulin E (IgE). These antibodies travel to cells that release chemicals, causing an allergic reaction.  The concept behind allergy immunotherapy, whether it is received in the form of shots or tablets, is that the immune system can be desensitized to specific allergens that trigger allergy symptoms. Although it requires time and patience, the payback can be long-term relief. Allergy injections contain a dilute solution of those substances that you are allergic to based upon your skin testing and allergy history.   How Do Allergy Shots Work?  Allergy shots work much like a vaccine. Your body responds to injected amounts of a particular allergen given in increasing doses, eventually developing a resistance and tolerance to it. Allergy shots can lead to decreased, minimal or no allergy symptoms.  There generally are two phases: build-up and maintenance. Build-up often ranges from three to six months and involves receiving injections with increasing amounts of the allergens. The shots are typically given once or twice a week, though more rapid build-up schedules are sometimes used.  The maintenance phase begins when the most effective dose is reached. This dose is different for each person, depending on how allergic you are and your response to the build-up injections. Once the maintenance dose is reached, there are longer periods between injections, typically two to four weeks.  Occasionally  doctors give cortisone-type shots that can temporarily reduce allergy symptoms. These types of shots are different and should not be confused with allergy immunotherapy shots.  Who Can Be Treated with Allergy Shots?  Allergy shots may be a good treatment approach for people with allergic rhinitis (hay fever), allergic asthma, conjunctivitis (eye allergy) or stinging insect allergy.   Before deciding to begin allergy shots, you should consider:   The length of allergy season and the severity of your symptoms  Whether medications and/or changes to your environment can control your symptoms  Your desire to avoid long-term medication use  Time: allergy immunotherapy requires a major time commitment  Cost: may vary depending on your insurance coverage  Allergy shots for children age 71 and older are effective and often well tolerated. They might prevent the onset of new allergen sensitivities or the progression to asthma.  Allergy shots are not started on patients who are pregnant but can be continued on patients who become pregnant while receiving them. In some patients with other medical conditions or who take certain common medications, allergy shots may be of risk. It is important to mention other medications you talk to  your allergist.   What are the two types of build-ups offered:   RUSH or Rapid Desensitization -- one day of injections lasting from 8:30-4:30pm, injections every 1 hour.  Approximately half of the build-up process is completed in that one day.  The following week, normal build-up is resumed, and this entails ~16 visits either weekly or twice weekly, until reaching your "maintenance dose" which is continued weekly until eventually getting spaced out to every month for a duration of 3 to 5 years. The regular build-up appointments are nurse visits where the injections are administered, followed by required monitoring for 30 minutes.    Traditional build-up -- weekly visits for  6 -12 months until reaching "maintenance dose", then continue weekly until eventually spacing out to every 4 weeks as above. At these appointments, the injections are administered, followed by required monitoring for 30 minutes.     Either way is acceptable, and both are equally effective. With the rush protocol, the advantage is that less time is spent here for injections overall AND you would also reach maintenance dosing faster (which is when the clinical benefit starts to become more apparent). Not everyone is a candidate for rapid desensitization.   IF we proceed with the RUSH protocol, there are premedications which must be taken the day before and the day after the rush only (this includes antihistamines, steroids, and Singulair ).  After the rush day, no prednisone or Singulair  is required, and we just recommend antihistamines taken on your injection day.  What Is An Estimate of the Costs?  If you are interested in starting allergy injections, please check with your insurance company about your coverage for both allergy vial sets and allergy injections.  Please do so prior to making the appointment to start injections.  The following are CPT codes to give to your insurance company. These are the amounts we BILL to the insurance company, but the amount YOU WILL PAY and WE RECEIVE IS SUBSTANTIALLY LESS and depends on the contracts we have with different insurance companies.   Amount Billed to Insurance One allergy vial set  CPT 95165   $ 1200     Two allergy vial set  CPT 95165   $ 2400     Three allergy vial set  CPT 95165   $ 3600     One injection   CPT 95115   $ 35  Two injections   CPT 95117   $ 40 RUSH (Rapid Desensitization) CPT 95180 x 8 hours $500/hour  Regarding the allergy injections, your co-pay may or may not apply with each injection, so please confirm this with your insurance company. When you start allergy injections, 1 or 2 sets of vials are made based on your allergies.  Not  all patients can be on one set of vials. A set of vials lasts 6 months to a year depending on how quickly you can proceed with your build-up of your allergy injections. Vials are personalized for each patient depending on their specific allergens.  How often are allergy injection given during the build-up period?   Injections are given at least weekly during the build-up period until your maintenance dose is achieved. Per the doctor's discretion, you may have the option of getting allergy injections two times per week during the build-up period. However, there must be at least 48 hours between injections. The build-up period is usually completed within 6-12 months depending on your ability to schedule injections and for adjustments for reactions. When maintenance  dose is reached, your injection schedule is gradually changed to every two weeks and later to every three weeks. Injections will then continue every 4 weeks. Usually, injections are continued for a total of 3-5 years.   When Will I Feel Better?  Some may experience decreased allergy symptoms during the build-up phase. For others, it may take as long as 12 months on the maintenance dose. If there is no improvement after a year of maintenance, your allergist will discuss other treatment options with you.  If you aren't responding to allergy shots, it may be because there is not enough dose of the allergen in your vaccine or there are missing allergens that were not identified during your allergy testing. Other reasons could be that there are high levels of the allergen in your environment or major exposure to non-allergic triggers like tobacco smoke.  What Is the Length of Treatment?  Once the maintenance dose is reached, allergy shots are generally continued for three to five years. The decision to stop should be discussed with your allergist at that time. Some people may experience a permanent reduction of allergy symptoms. Others may relapse  and a longer course of allergy shots can be considered.  What Are the Possible Reactions?  The two types of adverse reactions that can occur with allergy shots are local and systemic. Common local reactions include very mild redness and swelling at the injection site, which can happen immediately or several hours after. Report a delayed reaction from your last injection. These include arm swelling or runny nose, watery eyes or cough that occurs within 12-24 hours after injection. A systemic reaction, which is less common, affects the entire body or a particular body system. They are usually mild and typically respond quickly to medications. Signs include increased allergy symptoms such as sneezing, a stuffy nose or hives.   Rarely, a serious systemic reaction called anaphylaxis can develop. Symptoms include swelling in the throat, wheezing, a feeling of tightness in the chest, nausea or dizziness. Most serious systemic reactions develop within 30 minutes of allergy shots. This is why it is strongly recommended you wait in your doctor's office for 30 minutes after your injections. Your allergist is trained to watch for reactions, and his or her staff is trained and equipped with the proper medications to identify and treat them.   Report to the nurse immediately if you experience any of the following symptoms: swelling, itching or redness of the skin, hives, watery eyes/nose, breathing difficulty, excessive sneezing, coughing, stomach pain, diarrhea, or light headedness. These symptoms may occur within 15-20 minutes after injection and may require medication.   Who Should Administer Allergy Shots?  The preferred location for receiving shots is your prescribing allergist's office. Injections can sometimes be given at another facility where the physician and staff are trained to recognize and treat reactions, and have received instructions by your prescribing allergist.  What if I am late for an  injection?   Injection dose will be adjusted depending upon how many days or weeks you are late for your injection.   What if I am sick?   Please report any illness to the nurse before receiving injections. She may adjust your dose or postpone injections depending on your symptoms. If you have fever, flu, sinus infection or chest congestion it is best to postpone allergy injections until you are better. Never get an allergy injection if your asthma is causing you problems. If your symptoms persist, seek out medical care to  get your health problem under control.  What If I am or Become Pregnant:  Women that become pregnant should schedule an appointment with [facility] before receiving any further allergy injections.

## 2023-10-20 NOTE — Progress Notes (Signed)
 FOLLOW UP  Date of Service/Encounter:  10/20/23   Assessment:   Moderate persistent asthma, uncomplicated   Perennial seasonal allergic rhinitis (grasses, ragweed, weeds, trees, dust mites, cat, and dog)  Sensorineural hearing loss (SNHL) of left ear   Flexural eczema   Seafood allergy    Plan/Recommendations:   1. Moderate persistent asthma, uncomplicated - Lung testing looked excellent at the last visit.  - Daily controller medication(s): Singulair  10mg  daily and Breztri  160/9/4.58mcg two puffs twice daily - Prior to physical activity: AirSupra  2 puffs 10-15 minutes before physical activity. - Rescue medications: AirSupra  2 puffs every 4-6 hours as needed - Asthma control goals:  * Full participation in all desired activities (may need albuterol before activity) * Albuterol use two time or less a week on average (not counting use with activity) * Cough interfering with sleep two time or less a month * Oral steroids no more than once a year * No hospitalizations  2. Chronic rhinitis - Testing today showed: grasses, ragweed, weeds, trees, dust mites, cat, and dog - Copy of test results provided.  - Avoidance measures provided. - Stop taking: Claritin (loratadine) - Continue with: Singulair  (montelukast ) 10mg  daily - Start taking: Xyzal (levocetirizine) 5mg  tablet once daily and Ryaltris (olopatadine/mometasone) two sprays per nostril 1-2 times daily as needed - You can use an extra dose of the antihistamine, if needed, for breakthrough symptoms.  - Consider nasal saline rinses 1-2 times daily to remove allergens from the nasal cavities as well as help with mucous clearance (this is especially helpful to do before the nasal sprays are given) - Consider allergy shots as a means of long-term control. - Allergy shots "re-train" and "reset" the immune system to ignore environmental allergens and decrease the resulting immune response to those allergens (sneezing, itchy watery  eyes, runny nose, nasal congestion, etc).    - Allergy shots improve symptoms in 75-85% of patients.  - We can discuss more at the next appointment if the medications are not working for you.  3. Seafood allergy - Testing was negative to the seafood and other foods tested. - Copy of testing results provided.  - We are confirming the seafood with blood work.  - Emergency Action Plan provided. - Neffy sent in.   4. Eczema  - Continue with the Dermatology recommended ointments.  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  - Emergency Action Plan provided.  6. Return in about 3 months (around 01/20/2024). You can have the follow up appointment with Dr. Idolina Maker or a Nurse Practicioner (our Nurse Practitioners are excellent and always have Physician oversight!).    Subjective:   Faith Moody is a 43 y.o. female presenting today for follow up of No chief complaint on file.   Faith Moody has a history of the following: Patient Active Problem List   Diagnosis Date Noted   Polycystic ovary syndrome 04/26/2018   Rectal bleeding 01/10/2018   Premenstrual dysphoric disorder 09/04/2014   Non-toxic multinodular goiter 12/29/2012   Vitamin D deficiency 12/14/2012   Chronic mixed headache syndrome 05/14/2011   Fatigue 10/02/2010   IBS (irritable bowel syndrome) 09/10/2010   Metabolic syndrome 09/10/2010   Obstructive sleep apnea 09/10/2010   Allergic rhinitis 06/24/2010   Bronchial asthma 06/24/2010   GERD (gastroesophageal reflux disease) 06/24/2010   Obesity 06/24/2010   Recurrent sinus infections 06/24/2010    History obtained from: chart review and patient.  Discussed the use of AI scribe software for clinical note  transcription with the patient and/or guardian, who gave verbal consent to proceed.  Faith Moody is a 43 y.o. female presenting for skin testing. She was last seen on May 22. We could not do testing because her insurance company does not  cover testing on the same day as a New Patient visit. She has been off of all antihistamines 3 days in anticipation of the testing.   At that visit, lung testing looked excellent.  We decided to add Breztri  2 puffs twice daily to see if this would help.  Her rhinitis, we recommended restarting the Singulair  and getting allergy testing done.  For her left-sided hearing loss, we referred her to see ENT.  We gave her an emergency action plan and sent in nasal epinephrine.  We obtained blood work to look for stinging insect allergy.  Otherwise, there have been no changes to her past medical history, surgical history, family history, or social history.    Review of systems otherwise negative other than that mentioned in the HPI.    Objective:   There were no vitals taken for this visit. There is no height or weight on file to calculate BMI.    Physical exam deferred since this was a skin testing appointment only.   Diagnostic studies:   Allergy Studies:     Airborne Adult Perc - 10/20/23 1457     Time Antigen Placed 1458    Allergen Manufacturer Floyd Hutchinson    Location Back    Number of Test 55    1. Control-Buffer 50% Glycerol Negative    2. Control-Histamine 2+    3. Bahia Negative    4. [place] 3+    5. Johnson 3+    6. [place]  Blue 3+    7. Meadow Fescue 3+    8. Perennial Rye 3+    9. Timothy 3+    10. Ragweed Mix Negative    11. Cocklebur Negative    12. Plantain,  English Negative    13. Baccharis Negative    14. Dog Fennel 2+    15. Russian Thistle Negative    16. Lamb's Quarters Negative    17. Sheep Sorrell 3+    18. Rough Pigweed 3+    19. Marsh Elder, Rough 3+    20. Mugwort, Common Negative    21. Box, Elder 2+    22. Cedar, red Negative    23. Sweet Gum 3+    24. Pecan Pollen 2+    25. Pine Mix 2+    26. Walnut, Black Pollen 2+    27. Red Mulberry 2+    28. Ash Mix 2+    29. Birch Mix 4+    30. Beech American 2+    31. Cottonwood, [place] 2+    32.  Hickory, White 4+    33. Maple Mix 2+    34. Oak, [place] Mix 4+    35. Sycamore Eastern 2+    36. Alternaria Alternata Negative    37. Cladosporium Herbarum Negative    38. Aspergillus Mix Negative    39. Penicillium Mix Negative    40. Bipolaris Sorokiniana (Helminthosporium) Negative    41. Drechslera Spicifera (Curvularia) Negative    42. Mucor Plumbeus Negative    43. Fusarium Moniliforme Negative    44. Aureobasidium Pullulans (pullulara) Negative    45. Rhizopus Oryzae Negative    46. Botrytis Cinera Negative    47. Epicoccum Nigrum Negative    48. Phoma Betae Negative    49.  Dust Mite Mix Negative    50. Cat Hair 10,000 BAU/ml Negative    51.  Dog Epithelia Negative    52. Mixed Feathers Negative    53. Horse Epithelia Negative    54. Cockroach, German Negative    55. Tobacco Leaf Negative             Intradermal - 10/20/23 1600     Control Negative    Bahia 4+    Ragweed Mix 4+    Mold 1 Negative    Mold 2 Negative    Mold 3 Negative    Mold 4 Negative    Mite Mix 3+    Cat 3+    Dog 3+    Cockroach Negative             Food Adult Perc - 10/20/23 1500     Time Antigen Placed 1457    Allergen Manufacturer Floyd Hutchinson    Location Back    Number of allergen test 15    7. Egg White, Chicken Negative    8. Shellfish Mix Negative    9. Fish Mix Negative    12. Almond Negative    18. Trout Negative    19. Tuna Negative    20. Salmon Negative    21. Flounder Negative    22. Codfish Negative    23. Shrimp Negative    24. Crab Negative    25. Lobster Negative    26. Oyster Negative    27. Scallops Negative    58. Apple Negative                 Allergy testing results were read and interpreted by myself, documented by clinical staff.      Drexel Gentles, MD  [facility] 

## 2023-10-24 ENCOUNTER — Encounter: Payer: Self-pay | Admitting: Allergy & Immunology

## 2023-10-27 ENCOUNTER — Ambulatory Visit (HOSPITAL_BASED_OUTPATIENT_CLINIC_OR_DEPARTMENT_OTHER)
Admission: EM | Admit: 2023-10-27 | Discharge: 2023-10-27 | Disposition: A | Discharge status: Home / Self Care | Attending: Family Medicine | Admitting: Family Medicine

## 2023-10-27 ENCOUNTER — Encounter (HOSPITAL_BASED_OUTPATIENT_CLINIC_OR_DEPARTMENT_OTHER): Payer: Self-pay

## 2023-10-27 ENCOUNTER — Other Ambulatory Visit (HOSPITAL_BASED_OUTPATIENT_CLINIC_OR_DEPARTMENT_OTHER): Payer: Self-pay

## 2023-10-27 DIAGNOSIS — R531 Weakness: Secondary | ICD-10-CM

## 2023-10-27 DIAGNOSIS — G44209 Tension-type headache, unspecified, not intractable: Secondary | ICD-10-CM

## 2023-10-27 DIAGNOSIS — K219 Gastro-esophageal reflux disease without esophagitis: Secondary | ICD-10-CM

## 2023-10-27 MED ORDER — PANTOPRAZOLE SODIUM 40 MG PO TBEC
40.0000 mg | DELAYED_RELEASE_TABLET | Freq: Every day | ORAL | 1 refills | Status: AC
  Filled 2023-10-27: qty 30, 30d supply, fill #0
  Filled 2023-12-23: qty 30, 30d supply, fill #1

## 2023-10-27 MED ORDER — DEXAMETHASONE SODIUM PHOSPHATE 10 MG/ML IJ SOLN
10.0000 mg | Freq: Once | INTRAMUSCULAR | Status: AC
  Administered 2023-10-27: 10 mg via INTRAMUSCULAR

## 2023-10-27 NOTE — ED Provider Notes (Signed)
 [facility]    CSN: [id number] Arrival date & time: 10/27/23  1053      History   Chief Complaint Chief Complaint  Patient presents with   Dizziness    HPI Faith Moody is a 43 y.o. female.   Patient is a 43 year old female that presents with generalized weakness, headache. states last Saturday became ill with diarrhea and abd pain. Was seen at  [facility] ER on Monday and had blood work, urine, and CT scan done. States was told she was dehydrated. No UTI, No appendicitis. Given 2 Liters of Saline  and prescribed zofran  and promethazine. States still feeling headache, weak, and fatigued. States drinking fluids and electrolytes but not feeling better.  Headache is generalized and different from her typical migraines.  The abdominal pain has improved.  She still having some pretty bad acid reflux.  Has been on Protonix in the past but currently not taking this.  Believes this may be related to her nausea.  She has stopped taking the promethazine and has been taking Zofran .  She has been pretty constipated due to this. Was told in the ER probable virus.    Dizziness   Past Medical History:  Diagnosis Date   Allergy    Anxiety    Asthma    Depression    Eczema    Thyroid disease     Patient Active Problem List   Diagnosis Date Noted   Polycystic ovary syndrome 04/26/2018   Rectal bleeding 01/10/2018   Premenstrual dysphoric disorder 09/04/2014   Non-toxic multinodular goiter 12/29/2012   Vitamin D deficiency 12/14/2012   Chronic mixed headache syndrome 05/14/2011   Fatigue 10/02/2010   IBS (irritable bowel syndrome) 09/10/2010   Metabolic syndrome 09/10/2010   Obstructive sleep apnea 09/10/2010   Allergic rhinitis 06/24/2010   Bronchial asthma 06/24/2010   GERD (gastroesophageal reflux disease) 06/24/2010   Obesity 06/24/2010   Recurrent sinus infections 06/24/2010    History reviewed. No pertinent surgical history.  OB History   No obstetric history  on file.      Home Medications    Prior to Admission medications   Medication Sig Start Date End Date Taking? Authorizing Provider  pantoprazole (PROTONIX) 40 MG tablet Take 1 tablet (40 mg total) by mouth daily. 10/27/23  Yes Ronisha Herringshaw A, FNP  Albuterol Sulfate (PROAIR RESPICLICK IN) Inhale into the lungs.    [provider]  Albuterol-Budesonide (AIRSUPRA ) 90-80 MCG/ACT AERO Inhale 2 puffs into the lungs every 4 (four) hours as needed. 10/06/23   Rochester Chuck, MD  amitriptyline (ELAVIL) 10 MG tablet Take 10 mg by mouth at bedtime. 04/27/23   [provider]  baclofen (LIORESAL) 10 MG tablet Take 10 mg by mouth.    [provider]  budesonide-glycopyrrolate-formoterol (BREZTRI  AEROSPHERE) 160-9-4.8 MCG/ACT AERO inhaler Inhale 1-2 puffs into the lungs in the morning and at bedtime. 10/06/23   Rochester Chuck, MD  busPIRone (BUSPAR) 5 MG tablet Take by mouth.    [provider]  clobetasol (TEMOVATE) 0.05 % external solution     [provider]  clonazePAM  (KLONOPIN ) 0.5 MG tablet Take 0.5 tablets (0.25 mg total) by mouth 2 (two) times daily as needed for anxiety. 09/04/14   Bettie Bruce, PA-C  cloNIDine (CATAPRES) 0.2 MG tablet Take by mouth.    [provider]  cyclobenzaprine (FLEXERIL) 5 MG tablet     [provider]  doxycycline (VIBRAMYCIN) 100 MG capsule Take 100 mg by mouth daily.  [provider]  DULoxetine (CYMBALTA) 30 MG capsule Take 30 mg by mouth. 09/28/23   [provider]  EPINEPHrine (NEFFY) 2 MG/0.1ML SOLN Place 1 spray into the nose as needed. 10/06/23   Rochester Chuck, MD  Erenumab -aooe (AIMOVIG ) 70 MG/ML SOAJ Inject 70 mg into the skin every 30 (thirty) days. 08/17/18   Festus Hubert, Adam R, DO  fluocinonide (LIDEX) 0.05 % external solution     [provider]  loratadine (CLARITIN) 10 MG tablet Take 10 mg by mouth daily.    [provider]  mometasone  (NASONEX) 50 MCG/ACT nasal spray Place 2 sprays into the nose daily.    [provider]  montelukast  (SINGULAIR ) 10 MG tablet Take 1 tablet (10 mg total) by mouth at bedtime. 10/06/23   Rochester Chuck, MD  sertraline (ZOLOFT) 100 MG tablet Take 100 mg by mouth daily.    [provider]    Family History Family History  Problem Relation Age of Onset   Allergic rhinitis Mother    Thyroid disease Mother    Cancer Maternal Grandmother    Hypertension Maternal Grandmother    Hypertension Maternal Grandfather    Cancer Maternal Grandfather    Hypertension Paternal Grandmother    Cancer Paternal Grandmother    Hypertension Paternal Grandfather    Cancer Paternal Grandfather     Social History Social History   Tobacco Use   Smoking status: Never    Passive exposure: Past   Smokeless tobacco: Never  Substance Use Topics   Alcohol use: Not Currently    Alcohol/week: 0.0 standard drinks of alcohol   Drug use: Not Currently     Allergies   Apple juice, Fish allergy, Peanuts [peanut oil], Amoxicillin, Molds & smuts, Percocet [oxycodone-acetaminophen ], and Prednisone   Review of Systems Review of Systems  Neurological:  Positive for dizziness.   See HPI  Physical Exam Triage Vital Signs ED Triage Vitals  Encounter Vitals Group     BP 10/27/23 1117 113/78     Girls Systolic BP Percentile --      Girls Diastolic BP Percentile --      Boys Systolic BP Percentile --      Boys Diastolic BP Percentile --      Pulse Rate 10/27/23 1117 84     Resp 10/27/23 1117 20     Temp 10/27/23 1117 97.8 F (36.6 C)     Temp Source 10/27/23 1117 Oral     SpO2 10/27/23 1117 98 %     Weight --      Height --      Head Circumference --      Peak Flow --      Pain Score 10/27/23 1119 5     Pain Loc --      Pain Education --      Exclude from Growth Chart --    No data found.  Updated Vital Signs BP 113/78 (BP Location: Right Arm)   Pulse 84   Temp 97.8 F  (36.6 C) (Oral)   Resp 20   LMP 10/05/2023   SpO2 98%   Visual Acuity Right Eye Distance:   Left Eye Distance:   Bilateral Distance:    Right Eye Near:   Left Eye Near:    Bilateral Near:     Physical Exam Vitals and nursing note reviewed.  Constitutional:      General: She is not in acute distress.    Appearance: Normal appearance. She is  not ill-appearing, toxic-appearing or diaphoretic.   Eyes:     Conjunctiva/sclera: Conjunctivae normal.    Cardiovascular:     Rate and Rhythm: Normal rate and regular rhythm.  Pulmonary:     Effort: Pulmonary effort is normal.     Breath sounds: Normal breath sounds.   Musculoskeletal:        General: Normal range of motion.   Skin:    General: Skin is warm and dry.   Neurological:     Mental Status: She is alert.   Psychiatric:        Mood and Affect: Mood normal.      UC Treatments / Results  Labs (all labs ordered are listed, but only abnormal results are displayed) Labs Reviewed - No data to display  EKG   Radiology No results found.  Procedures Procedures (including critical care time)  Medications Ordered in UC Medications  dexamethasone (DECADRON) injection 10 mg (10 mg Intramuscular Given 10/27/23 1211)    Initial Impression / Assessment and Plan / UC Course  I have reviewed the triage vital signs and the nursing notes.  Pertinent labs & imaging results that were available during my care of the patient were reviewed by me and considered in my medical decision making (see chart for details).     Weakness, GERD and tension headaches-patient with generalized weakness, headache and was recently seen in the ER and treated for possible virus, given fluids and rehydrated.  CT scan and blood work unremarkable in the ER.  I believe most likely she is still suffering from the lingering virus and weak due to dehydration from all the diarrhea.  She is having some pretty bad reflux and has been on Protonix in the  past.  Recommend starting back on this to see if this helps with her acid reflux and nausea. Decadron steroid injection given here for her tension headache today.  Recommend to continue to sip fluids, electrolytes and bland diet as tolerated. She will need to follow-up with her doctor for any continued issues. Work note provided  Final Clinical Impressions(s) / UC Diagnoses   Final diagnoses:  Weakness  Gastroesophageal reflux disease, unspecified whether esophagitis present  Tension headache     Discharge Instructions      I believe that you are still recovering from a virus.  I am prescribing Protonix for your acid reflux.  Take this as prescribed daily. Stop the Zofran   Decadron steroid injection given here for your headache Continue to sip electrolytes and water to stay hydrated.  Continue bland diet as tolerated. Follow-up with your doctor for any continued issues    ED Prescriptions     Medication Sig Dispense Auth. Provider   pantoprazole (PROTONIX) 40 MG tablet Take 1 tablet (40 mg total) by mouth daily. 30 tablet Landa Pine, FNP      PDMP not reviewed this encounter.   Landa Pine, FNP 10/27/23 1601

## 2023-10-27 NOTE — ED Triage Notes (Signed)
 States last Saturday became ill with diarrhea and abd pain. Was seen at  [facility] ER on Monday and had blood work, urine, and CT scan done. States was told she was dehydrated. No UTI, No appendicitis. Given 2 Liters of Saline  and prescribed zofran  and promethazine. States still feeling headache, weak, and fatigued. States drinking fluids and electrolytes but not feeling better.

## 2023-10-27 NOTE — Discharge Instructions (Signed)
 I believe that you are still recovering from a virus.  I am prescribing Protonix for your acid reflux.  Take this as prescribed daily. Stop the Zofran   Decadron steroid injection given here for your headache Continue to sip electrolytes and water to stay hydrated.  Continue bland diet as tolerated. Follow-up with your doctor for any continued issues

## 2023-10-31 LAB — HYMENOPTERA VENOM ALLERGY PROF

## 2023-11-03 ENCOUNTER — Encounter (INDEPENDENT_AMBULATORY_CARE_PROVIDER_SITE_OTHER): Payer: Self-pay

## 2023-11-09 ENCOUNTER — Ambulatory Visit: Payer: Self-pay | Admitting: Allergy & Immunology

## 2023-11-09 LAB — HYMENOPTERA VENOM ALLERGY PROF: Tryptase: 3.7 ug/L (ref 2.2–13.2)

## 2023-11-09 LAB — ALLERGY PANEL 19, SEAFOOD GROUP
Allergen Salmon IgE: <0.1 kU/L
Codfish IgE: <0.1 kU/L
F023-IgE Crab: 0.41 kU/L — AB
F080-IgE Lobster: 0.21 kU/L — AB

## 2023-11-09 LAB — ALLERGEN COMPONENT COMMENTS

## 2023-11-09 LAB — I005-IGE HORNET, YELLOW

## 2023-11-09 LAB — I002-IGE HORNET, WHITE FACE

## 2023-11-14 NOTE — Addendum Note (Signed)
 Addended by: FRANCIS SANTANA LABOR on: 11/14/2023 05:37 PM   Modules accepted: Orders

## 2023-12-12 ENCOUNTER — Encounter: Payer: Self-pay | Admitting: Allergy & Immunology

## 2023-12-15 ENCOUNTER — Other Ambulatory Visit: Payer: Self-pay | Admitting: *Deleted

## 2023-12-15 MED ORDER — NEFFY 2 MG/0.1ML NA SOLN: 1.0000 | NASAL | 1 refills | Status: AC | PRN

## 2023-12-22 MED ORDER — NEFFY 2 MG/0.1ML NA SOLN: 1.0000 | NASAL | 1 refills | Status: AC | PRN

## 2023-12-22 NOTE — Addendum Note (Signed)
 Addended by: MARCINE ISAIAH CROME on: 12/22/2023 10:52 AM   Modules accepted: Orders

## 2023-12-23 ENCOUNTER — Other Ambulatory Visit (HOSPITAL_BASED_OUTPATIENT_CLINIC_OR_DEPARTMENT_OTHER): Payer: Self-pay

## 2024-01-19 ENCOUNTER — Ambulatory Visit: Admitting: Allergy & Immunology
